# Patient Record
Sex: Male | Born: 1935 | Race: White | Hispanic: No
Health system: Southern US, Community
[De-identification: ages and names within clinical notes are randomized; demographics above are authoritative.]

---

## 2005-01-22 ENCOUNTER — Emergency Department: Payer: Self-pay | Admitting: Emergency Medicine

## 2005-04-01 ENCOUNTER — Other Ambulatory Visit: Payer: Self-pay

## 2005-04-01 ENCOUNTER — Emergency Department: Payer: Self-pay | Admitting: Unknown Physician Specialty

## 2005-04-27 ENCOUNTER — Ambulatory Visit: Payer: Self-pay | Admitting: Internal Medicine

## 2005-05-06 ENCOUNTER — Ambulatory Visit: Payer: Self-pay | Admitting: Internal Medicine

## 2005-05-17 ENCOUNTER — Ambulatory Visit: Payer: Self-pay | Admitting: Oncology

## 2005-05-27 ENCOUNTER — Ambulatory Visit: Payer: Self-pay | Admitting: Oncology

## 2005-06-21 ENCOUNTER — Ambulatory Visit: Payer: Self-pay | Admitting: Oncology

## 2005-06-26 ENCOUNTER — Ambulatory Visit: Payer: Self-pay | Admitting: Oncology

## 2005-07-05 ENCOUNTER — Other Ambulatory Visit: Payer: Self-pay

## 2005-07-29 ENCOUNTER — Ambulatory Visit: Payer: Self-pay | Admitting: Otolaryngology

## 2005-07-29 ENCOUNTER — Other Ambulatory Visit: Payer: Self-pay

## 2005-08-03 ENCOUNTER — Other Ambulatory Visit: Payer: Self-pay

## 2005-08-03 ENCOUNTER — Inpatient Hospital Stay: Payer: Self-pay

## 2005-09-29 ENCOUNTER — Ambulatory Visit: Payer: Self-pay | Admitting: Oncology

## 2005-10-27 ENCOUNTER — Ambulatory Visit: Payer: Self-pay | Admitting: Oncology

## 2005-11-17 ENCOUNTER — Other Ambulatory Visit: Payer: Self-pay

## 2005-11-24 ENCOUNTER — Ambulatory Visit: Payer: Self-pay | Admitting: Oncology

## 2005-12-25 ENCOUNTER — Ambulatory Visit: Payer: Self-pay | Admitting: Oncology

## 2006-02-09 ENCOUNTER — Ambulatory Visit: Payer: Self-pay | Admitting: Oncology

## 2006-02-24 ENCOUNTER — Ambulatory Visit: Payer: Self-pay | Admitting: Oncology

## 2006-03-28 ENCOUNTER — Observation Stay: Payer: Self-pay | Admitting: Internal Medicine

## 2006-03-28 ENCOUNTER — Other Ambulatory Visit: Payer: Self-pay

## 2006-04-21 ENCOUNTER — Ambulatory Visit: Payer: Self-pay | Admitting: Oncology

## 2006-04-26 ENCOUNTER — Ambulatory Visit: Payer: Self-pay | Admitting: Oncology

## 2006-06-09 ENCOUNTER — Ambulatory Visit: Payer: Self-pay | Admitting: Oncology

## 2006-06-21 ENCOUNTER — Other Ambulatory Visit: Payer: Self-pay

## 2006-06-21 ENCOUNTER — Inpatient Hospital Stay: Payer: Self-pay

## 2006-06-26 ENCOUNTER — Ambulatory Visit: Payer: Self-pay | Admitting: Oncology

## 2006-08-25 ENCOUNTER — Ambulatory Visit: Payer: Self-pay | Admitting: Oncology

## 2006-10-20 ENCOUNTER — Ambulatory Visit: Payer: Self-pay | Admitting: Oncology

## 2006-10-27 ENCOUNTER — Ambulatory Visit: Payer: Self-pay | Admitting: Oncology

## 2006-11-25 ENCOUNTER — Ambulatory Visit: Payer: Self-pay | Admitting: Oncology

## 2007-01-25 ENCOUNTER — Ambulatory Visit: Payer: Self-pay | Admitting: Oncology

## 2007-02-25 ENCOUNTER — Ambulatory Visit: Payer: Self-pay | Admitting: Oncology

## 2007-04-27 ENCOUNTER — Ambulatory Visit: Payer: Self-pay | Admitting: Oncology

## 2007-05-02 ENCOUNTER — Ambulatory Visit: Payer: Self-pay | Admitting: Oncology

## 2007-05-28 ENCOUNTER — Ambulatory Visit: Payer: Self-pay | Admitting: Oncology

## 2007-09-27 IMAGING — CR DG OUTSIDE FILMS CHEST
1 series · 1 of 1 positions shown · non-contrast
Comparison: none

[view not recorded]
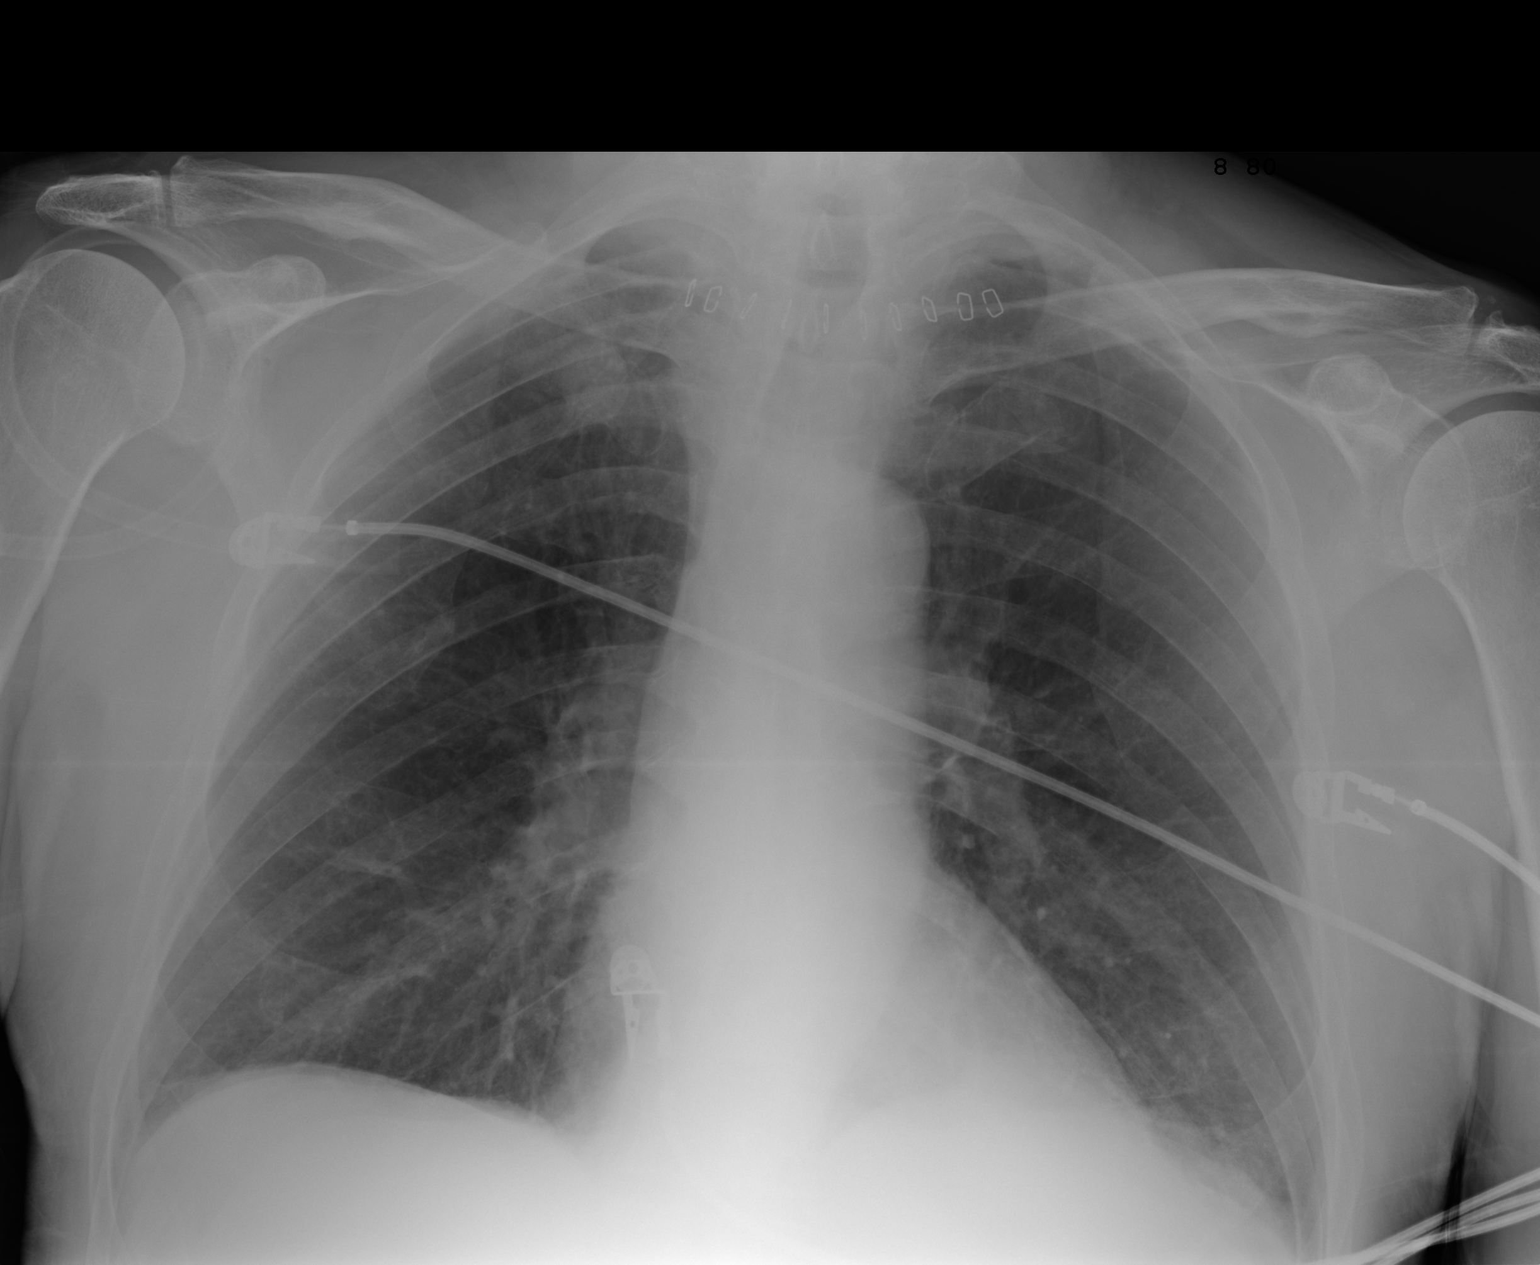

[1 of 1 positions shown; findings below may reference images not displayed]

Canned report from images found in remote index.

Refer to host system for actual result text.

## 2008-03-26 ENCOUNTER — Ambulatory Visit: Payer: Self-pay | Admitting: Oncology

## 2008-10-02 ENCOUNTER — Emergency Department: Payer: Self-pay | Admitting: Internal Medicine

## 2008-11-28 ENCOUNTER — Inpatient Hospital Stay: Payer: Self-pay | Admitting: Internal Medicine

## 2010-11-02 ENCOUNTER — Ambulatory Visit: Payer: Self-pay | Admitting: Internal Medicine

## 2010-11-08 ENCOUNTER — Emergency Department: Payer: Self-pay | Admitting: Emergency Medicine

## 2011-04-02 ENCOUNTER — Emergency Department: Payer: Self-pay | Admitting: Emergency Medicine

## 2011-04-08 ENCOUNTER — Observation Stay: Payer: Self-pay | Admitting: Internal Medicine

## 2011-07-07 ENCOUNTER — Ambulatory Visit: Payer: Self-pay | Admitting: Internal Medicine

## 2011-08-20 ENCOUNTER — Inpatient Hospital Stay: Payer: Self-pay | Admitting: Internal Medicine

## 2012-03-22 ENCOUNTER — Inpatient Hospital Stay: Payer: Self-pay | Admitting: Internal Medicine

## 2012-03-22 LAB — CBC
HCT: 40.4 % (ref 40.0–52.0)
MCHC: 33.7 g/dL (ref 32.0–36.0)
MCV: 99 fL (ref 80–100)
Platelet: 194 10*3/uL (ref 150–440)
RBC: 4.07 10*6/uL — ABNORMAL LOW (ref 4.40–5.90)
RDW: 13.2 % (ref 11.5–14.5)
WBC: 16.1 10*3/uL — ABNORMAL HIGH (ref 3.8–10.6)

## 2012-03-22 LAB — URINALYSIS, COMPLETE
Bacteria: NONE SEEN
Bilirubin,UR: NEGATIVE
Blood: NEGATIVE
Leukocyte Esterase: NEGATIVE
Protein: NEGATIVE
RBC,UR: <1 /HPF (ref 0–5)
Specific Gravity: 1.022 (ref 1.003–1.030)
WBC UR: 1 /HPF (ref 0–5)

## 2012-03-22 LAB — PROTIME-INR: Prothrombin Time: 13 secs (ref 11.5–14.7)

## 2012-03-22 LAB — COMPREHENSIVE METABOLIC PANEL
Albumin: 4 g/dL (ref 3.4–5.0)
Alkaline Phosphatase: 74 U/L (ref 50–136)
Bilirubin,Total: 0.6 mg/dL (ref 0.2–1.0)
Calcium, Total: 8.8 mg/dL (ref 8.5–10.1)
Chloride: 106 mmol/L (ref 98–107)
EGFR (Non-African Amer.): 45 — ABNORMAL LOW
Glucose: 242 mg/dL — ABNORMAL HIGH (ref 65–99)
Osmolality: 293 (ref 275–301)
Sodium: 141 mmol/L (ref 136–145)

## 2012-03-23 LAB — CBC WITH DIFFERENTIAL/PLATELET
Basophil #: 0 10*3/uL (ref 0.0–0.1)
Basophil %: 0.2 %
Eosinophil #: 0.2 10*3/uL (ref 0.0–0.7)
HCT: 35.6 % — ABNORMAL LOW (ref 40.0–52.0)
HGB: 12.1 g/dL — ABNORMAL LOW (ref 13.0–18.0)
Lymphocyte #: 2.2 10*3/uL (ref 1.0–3.6)
Lymphocyte %: 11.6 %
MCH: 33.3 pg (ref 26.0–34.0)
MCV: 98 fL (ref 80–100)
Monocyte #: 1.4 x10 3/mm — ABNORMAL HIGH (ref 0.2–1.0)
Neutrophil #: 15.2 10*3/uL — ABNORMAL HIGH (ref 1.4–6.5)
RDW: 13.1 % (ref 11.5–14.5)
WBC: 19 10*3/uL — ABNORMAL HIGH (ref 3.8–10.6)

## 2012-03-23 LAB — BASIC METABOLIC PANEL
Anion Gap: 7 (ref 7–16)
BUN: 25 mg/dL — ABNORMAL HIGH (ref 7–18)
Calcium, Total: 8.6 mg/dL (ref 8.5–10.1)
Chloride: 106 mmol/L (ref 98–107)
EGFR (African American): 54 — ABNORMAL LOW
EGFR (Non-African Amer.): 47 — ABNORMAL LOW
Glucose: 146 mg/dL — ABNORMAL HIGH (ref 65–99)
Osmolality: 285 (ref 275–301)
Potassium: 3.9 mmol/L (ref 3.5–5.1)
Sodium: 139 mmol/L (ref 136–145)

## 2012-03-24 LAB — BASIC METABOLIC PANEL
BUN: 16 mg/dL (ref 7–18)
Chloride: 103 mmol/L (ref 98–107)
Co2: 24 mmol/L (ref 21–32)
Creatinine: 1.31 mg/dL — ABNORMAL HIGH (ref 0.60–1.30)
EGFR (African American): 56 — ABNORMAL LOW
EGFR (Non-African Amer.): 48 — ABNORMAL LOW
Sodium: 136 mmol/L (ref 136–145)

## 2012-03-24 LAB — CBC WITH DIFFERENTIAL/PLATELET
Basophil #: 0 10*3/uL (ref 0.0–0.1)
Basophil %: 0.2 %
HGB: 11.8 g/dL — ABNORMAL LOW (ref 13.0–18.0)
Lymphocyte #: 2.2 10*3/uL (ref 1.0–3.6)
Lymphocyte %: 17 %
MCHC: 33.6 g/dL (ref 32.0–36.0)
Monocyte %: 9.1 %
Neutrophil %: 70.3 %
Platelet: 161 10*3/uL (ref 150–440)
WBC: 12.9 10*3/uL — ABNORMAL HIGH (ref 3.8–10.6)

## 2012-03-24 LAB — TSH: Thyroid Stimulating Horm: 8.43 u[IU]/mL — ABNORMAL HIGH

## 2012-03-25 LAB — CBC WITH DIFFERENTIAL/PLATELET
Basophil %: 0.3 %
HGB: 11.1 g/dL — ABNORMAL LOW (ref 13.0–18.0)
Lymphocyte #: 1.8 10*3/uL (ref 1.0–3.6)
Lymphocyte %: 20 %
MCHC: 34 g/dL (ref 32.0–36.0)
MCV: 98 fL (ref 80–100)
Monocyte #: 0.9 x10 3/mm (ref 0.2–1.0)
Monocyte %: 10.3 %
RDW: 12.9 % (ref 11.5–14.5)
WBC: 9 10*3/uL (ref 3.8–10.6)

## 2012-03-25 LAB — BASIC METABOLIC PANEL
Anion Gap: 10 (ref 7–16)
Calcium, Total: 7.9 mg/dL — ABNORMAL LOW (ref 8.5–10.1)
Chloride: 106 mmol/L (ref 98–107)
EGFR (African American): >60
Osmolality: 277 (ref 275–301)
Sodium: 139 mmol/L (ref 136–145)

## 2012-03-28 LAB — CULTURE, BLOOD (SINGLE)

## 2012-07-08 ENCOUNTER — Inpatient Hospital Stay: Payer: Self-pay | Admitting: Internal Medicine

## 2012-07-08 LAB — URINALYSIS, COMPLETE
Bacteria: NONE SEEN
Bilirubin,UR: NEGATIVE
Glucose,UR: 50 mg/dL (ref 0–75)
Ketone: NEGATIVE
Protein: NEGATIVE
RBC,UR: <1 /HPF (ref 0–5)
Specific Gravity: 1.021 (ref 1.003–1.030)
Squamous Epithelial: NONE SEEN
WBC UR: 1 /HPF (ref 0–5)

## 2012-07-08 LAB — COMPREHENSIVE METABOLIC PANEL
Alkaline Phosphatase: 72 U/L (ref 50–136)
Chloride: 106 mmol/L (ref 98–107)
Co2: 27 mmol/L (ref 21–32)
Creatinine: 1.32 mg/dL — ABNORMAL HIGH (ref 0.60–1.30)
EGFR (African American): 55 — ABNORMAL LOW
EGFR (Non-African Amer.): 48 — ABNORMAL LOW
Glucose: 129 mg/dL — ABNORMAL HIGH (ref 65–99)
SGOT(AST): 34 U/L (ref 15–37)
SGPT (ALT): 22 U/L (ref 12–78)
Total Protein: 7.1 g/dL (ref 6.4–8.2)

## 2012-07-08 LAB — CK TOTAL AND CKMB (NOT AT ARMC): CK-MB: 1.9 ng/mL (ref 0.5–3.6)

## 2012-07-08 LAB — CBC
HCT: 37.2 % — ABNORMAL LOW (ref 40.0–52.0)
HGB: 12.8 g/dL — ABNORMAL LOW (ref 13.0–18.0)
MCH: 33.8 pg (ref 26.0–34.0)
Platelet: 156 10*3/uL (ref 150–440)
RBC: 3.78 10*6/uL — ABNORMAL LOW (ref 4.40–5.90)
WBC: 8.6 10*3/uL (ref 3.8–10.6)

## 2012-07-09 LAB — CBC WITH DIFFERENTIAL/PLATELET
Basophil #: 0 10*3/uL (ref 0.0–0.1)
Basophil %: 0.2 %
Eosinophil #: 0 10*3/uL (ref 0.0–0.7)
HCT: 36.8 % — ABNORMAL LOW (ref 40.0–52.0)
Lymphocyte #: 0.5 10*3/uL — ABNORMAL LOW (ref 1.0–3.6)
MCH: 34.4 pg — ABNORMAL HIGH (ref 26.0–34.0)
MCHC: 35 g/dL (ref 32.0–36.0)
MCV: 99 fL (ref 80–100)
Monocyte #: 0.2 x10 3/mm (ref 0.2–1.0)
Neutrophil %: 91.6 %
Platelet: 154 10*3/uL (ref 150–440)
RDW: 13.8 % (ref 11.5–14.5)
WBC: 8.3 10*3/uL (ref 3.8–10.6)

## 2012-07-09 LAB — URINALYSIS, COMPLETE
Hyaline Cast: 3
Ketone: NEGATIVE
Nitrite: NEGATIVE
Ph: 5 (ref 4.5–8.0)
Protein: NEGATIVE
Specific Gravity: 1.009 (ref 1.003–1.030)
WBC UR: 1 /HPF (ref 0–5)

## 2012-07-09 LAB — BASIC METABOLIC PANEL
BUN: 15 mg/dL (ref 7–18)
Chloride: 105 mmol/L (ref 98–107)
Co2: 28 mmol/L (ref 21–32)
Creatinine: 1.11 mg/dL (ref 0.60–1.30)
Osmolality: 284 (ref 275–301)
Potassium: 4.4 mmol/L (ref 3.5–5.1)
Sodium: 139 mmol/L (ref 136–145)

## 2012-07-11 LAB — EXPECTORATED SPUTUM ASSESSMENT W REFEX TO RESP CULTURE

## 2012-07-12 LAB — URINALYSIS, COMPLETE
Bacteria: NONE SEEN
Bilirubin,UR: NEGATIVE
Glucose,UR: 150 mg/dL (ref 0–75)
Leukocyte Esterase: NEGATIVE
Ph: 7 (ref 4.5–8.0)
RBC,UR: 1 /HPF (ref 0–5)
Squamous Epithelial: <1
WBC UR: <1 /HPF (ref 0–5)

## 2012-07-12 LAB — CREATININE, SERUM: Creatinine: 1.16 mg/dL (ref 0.60–1.30)

## 2012-07-14 LAB — CULTURE, BLOOD (SINGLE)

## 2012-08-14 ENCOUNTER — Ambulatory Visit: Payer: Self-pay | Admitting: Otolaryngology

## 2012-08-14 LAB — T4, FREE: Free Thyroxine: 1.21 ng/dL (ref 0.76–1.46)

## 2012-08-14 LAB — TSH: Thyroid Stimulating Horm: 1.5 u[IU]/mL

## 2012-09-06 ENCOUNTER — Ambulatory Visit: Payer: Self-pay | Admitting: Specialist

## 2012-09-26 ENCOUNTER — Ambulatory Visit: Payer: Self-pay | Admitting: Internal Medicine

## 2012-09-28 ENCOUNTER — Inpatient Hospital Stay: Payer: Self-pay | Admitting: Internal Medicine

## 2012-09-28 LAB — CBC WITH DIFFERENTIAL/PLATELET
Basophil %: 0.4 %
Eosinophil #: 0 10*3/uL (ref 0.0–0.7)
Eosinophil %: 0.2 %
Lymphocyte #: 0.6 10*3/uL — ABNORMAL LOW (ref 1.0–3.6)
MCH: 33.1 pg (ref 26.0–34.0)
MCV: 100 fL (ref 80–100)
Monocyte #: 0.5 x10 3/mm (ref 0.2–1.0)
Monocyte %: 2.8 %
Neutrophil #: 17.4 10*3/uL — ABNORMAL HIGH (ref 1.4–6.5)
Neutrophil %: 93.3 %
RBC: 3.86 10*6/uL — ABNORMAL LOW (ref 4.40–5.90)
WBC: 18.6 10*3/uL — ABNORMAL HIGH (ref 3.8–10.6)

## 2012-09-28 LAB — COMPREHENSIVE METABOLIC PANEL
Albumin: 4 g/dL (ref 3.4–5.0)
Alkaline Phosphatase: 123 U/L (ref 50–136)
Anion Gap: 16 (ref 7–16)
BUN: 28 mg/dL — ABNORMAL HIGH (ref 7–18)
Chloride: 102 mmol/L (ref 98–107)
EGFR (African American): 28 — ABNORMAL LOW
Glucose: 201 mg/dL — ABNORMAL HIGH (ref 65–99)
Osmolality: 289 (ref 275–301)
SGOT(AST): 65 U/L — ABNORMAL HIGH (ref 15–37)
Sodium: 139 mmol/L (ref 136–145)
Total Protein: 7.7 g/dL (ref 6.4–8.2)

## 2012-09-28 LAB — URINALYSIS, COMPLETE
Bilirubin,UR: NEGATIVE
Blood: NEGATIVE
Glucose,UR: >=500 mg/dL (ref 0–75)
Hyaline Cast: 1
Nitrite: NEGATIVE
Ph: 5 (ref 4.5–8.0)
RBC,UR: 5 /HPF (ref 0–5)
WBC UR: 1 /HPF (ref 0–5)

## 2012-09-28 LAB — RAPID INFLUENZA A&B ANTIGENS

## 2012-09-28 LAB — TROPONIN I: Troponin-I: <0.02 ng/mL

## 2012-09-29 LAB — CBC WITH DIFFERENTIAL/PLATELET
Basophil #: 0 10*3/uL (ref 0.0–0.1)
Basophil %: 0.1 %
Eosinophil #: 0 10*3/uL (ref 0.0–0.7)
Eosinophil %: 0 %
HGB: 11.6 g/dL — ABNORMAL LOW (ref 13.0–18.0)
Lymphocyte #: 1.2 10*3/uL (ref 1.0–3.6)
Lymphocyte %: 5.5 %
MCH: 34.6 pg — ABNORMAL HIGH (ref 26.0–34.0)
MCHC: 35.4 g/dL (ref 32.0–36.0)
Monocyte #: 0.9 x10 3/mm (ref 0.2–1.0)
Monocyte %: 4.4 %
Neutrophil %: 90 %
RBC: 3.36 10*6/uL — ABNORMAL LOW (ref 4.40–5.90)

## 2012-09-29 LAB — BASIC METABOLIC PANEL
Anion Gap: 12 (ref 7–16)
Calcium, Total: 8.5 mg/dL (ref 8.5–10.1)
Chloride: 107 mmol/L (ref 98–107)
Co2: 26 mmol/L (ref 21–32)
Creatinine: 1.6 mg/dL — ABNORMAL HIGH (ref 0.60–1.30)
EGFR (African American): 44 — ABNORMAL LOW
Glucose: 257 mg/dL — ABNORMAL HIGH (ref 65–99)
Osmolality: 310 (ref 275–301)
Sodium: 145 mmol/L (ref 136–145)

## 2012-09-30 LAB — BASIC METABOLIC PANEL
BUN: 40 mg/dL — ABNORMAL HIGH (ref 7–18)
Chloride: 111 mmol/L — ABNORMAL HIGH (ref 98–107)
Co2: 27 mmol/L (ref 21–32)
Creatinine: 1.21 mg/dL (ref 0.60–1.30)
EGFR (African American): >60
EGFR (Non-African Amer.): 53 — ABNORMAL LOW
Glucose: 358 mg/dL — ABNORMAL HIGH (ref 65–99)
Sodium: 146 mmol/L — ABNORMAL HIGH (ref 136–145)

## 2012-09-30 LAB — CBC WITH DIFFERENTIAL/PLATELET
Basophil #: 0 10*3/uL (ref 0.0–0.1)
Basophil %: 0.1 %
Eosinophil #: 0 10*3/uL (ref 0.0–0.7)
Eosinophil %: 0 %
HCT: 34.7 % — ABNORMAL LOW (ref 40.0–52.0)
Lymphocyte %: 5.3 %
MCHC: 33.7 g/dL (ref 32.0–36.0)
Monocyte %: 3.8 %
Neutrophil #: 15.5 10*3/uL — ABNORMAL HIGH (ref 1.4–6.5)
Neutrophil %: 90.8 %
Platelet: 156 10*3/uL (ref 150–440)
RDW: 13.4 % (ref 11.5–14.5)

## 2012-09-30 LAB — URINE CULTURE

## 2012-10-01 LAB — CBC WITH DIFFERENTIAL/PLATELET
Basophil %: 0 %
Eosinophil #: 0 10*3/uL (ref 0.0–0.7)
Eosinophil %: 0 %
HGB: 12.4 g/dL — ABNORMAL LOW (ref 13.0–18.0)
Lymphocyte #: 1 10*3/uL (ref 1.0–3.6)
MCH: 33.4 pg (ref 26.0–34.0)
MCHC: 34.3 g/dL (ref 32.0–36.0)
MCV: 97 fL (ref 80–100)
Neutrophil #: 14 10*3/uL — ABNORMAL HIGH (ref 1.4–6.5)
Platelet: 152 10*3/uL (ref 150–440)
RBC: 3.72 10*6/uL — ABNORMAL LOW (ref 4.40–5.90)
WBC: 15.8 10*3/uL — ABNORMAL HIGH (ref 3.8–10.6)

## 2012-10-01 LAB — BASIC METABOLIC PANEL
Anion Gap: 9 (ref 7–16)
Calcium, Total: 8.7 mg/dL (ref 8.5–10.1)
Chloride: 110 mmol/L — ABNORMAL HIGH (ref 98–107)
Co2: 27 mmol/L (ref 21–32)
Creatinine: 0.99 mg/dL (ref 0.60–1.30)
EGFR (African American): >60
EGFR (Non-African Amer.): >60
Glucose: 247 mg/dL — ABNORMAL HIGH (ref 65–99)
Potassium: 3.9 mmol/L (ref 3.5–5.1)
Sodium: 146 mmol/L — ABNORMAL HIGH (ref 136–145)

## 2012-10-02 LAB — CBC WITH DIFFERENTIAL/PLATELET
Basophil #: 0 10*3/uL (ref 0.0–0.1)
Basophil %: 0.2 %
Eosinophil #: 0 10*3/uL (ref 0.0–0.7)
HCT: 39.3 % — ABNORMAL LOW (ref 40.0–52.0)
HGB: 13.1 g/dL (ref 13.0–18.0)
Lymphocyte %: 11.5 %
MCHC: 33.5 g/dL (ref 32.0–36.0)
MCV: 98 fL (ref 80–100)
Monocyte #: 1 x10 3/mm (ref 0.2–1.0)
Monocyte %: 7 %
RBC: 4.03 10*6/uL — ABNORMAL LOW (ref 4.40–5.90)
WBC: 14.4 10*3/uL — ABNORMAL HIGH (ref 3.8–10.6)

## 2012-10-02 LAB — URINALYSIS, COMPLETE
Glucose,UR: >=500 mg/dL (ref 0–75)
Ph: 6 (ref 4.5–8.0)
Protein: NEGATIVE
RBC,UR: 1 /HPF (ref 0–5)
Specific Gravity: 1.029 (ref 1.003–1.030)
Squamous Epithelial: <1
WBC UR: 5 /HPF (ref 0–5)

## 2012-10-02 LAB — CULTURE, BLOOD (SINGLE)

## 2012-10-27 ENCOUNTER — Ambulatory Visit: Payer: Self-pay | Admitting: Internal Medicine

## 2012-11-01 ENCOUNTER — Emergency Department: Payer: Self-pay | Admitting: Emergency Medicine

## 2012-11-01 LAB — COMPREHENSIVE METABOLIC PANEL
Albumin: 2.8 g/dL — ABNORMAL LOW (ref 3.4–5.0)
Alkaline Phosphatase: 100 U/L (ref 50–136)
Anion Gap: 8 (ref 7–16)
BUN: 12 mg/dL (ref 7–18)
Bilirubin,Total: 0.3 mg/dL (ref 0.2–1.0)
Calcium, Total: 8.6 mg/dL (ref 8.5–10.1)
Chloride: 104 mmol/L (ref 98–107)
EGFR (African American): >60
EGFR (Non-African Amer.): >60
Glucose: 104 mg/dL — ABNORMAL HIGH (ref 65–99)
Potassium: 3.3 mmol/L — ABNORMAL LOW (ref 3.5–5.1)
SGPT (ALT): 17 U/L (ref 12–78)
Sodium: 140 mmol/L (ref 136–145)

## 2012-11-01 LAB — PRO B NATRIURETIC PEPTIDE: B-Type Natriuretic Peptide: 208 pg/mL (ref 0–450)

## 2012-11-01 LAB — CBC
HCT: 36.8 % — ABNORMAL LOW (ref 40.0–52.0)
MCH: 32.3 pg (ref 26.0–34.0)
MCHC: 33.5 g/dL (ref 32.0–36.0)
MCV: 97 fL (ref 80–100)
RBC: 3.82 10*6/uL — ABNORMAL LOW (ref 4.40–5.90)
RDW: 13.8 % (ref 11.5–14.5)

## 2012-11-01 LAB — TROPONIN I: Troponin-I: <0.02 ng/mL

## 2012-11-01 LAB — CK TOTAL AND CKMB (NOT AT ARMC): CK-MB: 1.3 ng/mL (ref 0.5–3.6)

## 2012-11-03 ENCOUNTER — Inpatient Hospital Stay: Payer: Self-pay | Admitting: Internal Medicine

## 2012-11-03 LAB — CBC
HCT: 33.9 % — ABNORMAL LOW (ref 40.0–52.0)
HGB: 11.6 g/dL — ABNORMAL LOW (ref 13.0–18.0)
MCH: 32.8 pg (ref 26.0–34.0)
MCHC: 34.3 g/dL (ref 32.0–36.0)
MCV: 96 fL (ref 80–100)
Platelet: 362 10*3/uL (ref 150–440)
RBC: 3.54 10*6/uL — ABNORMAL LOW (ref 4.40–5.90)

## 2012-11-03 LAB — COMPREHENSIVE METABOLIC PANEL
Anion Gap: 7 (ref 7–16)
BUN: 12 mg/dL (ref 7–18)
Calcium, Total: 8.2 mg/dL — ABNORMAL LOW (ref 8.5–10.1)
Chloride: 105 mmol/L (ref 98–107)
Co2: 28 mmol/L (ref 21–32)
Creatinine: 1.03 mg/dL (ref 0.60–1.30)
EGFR (African American): >60
EGFR (Non-African Amer.): >60
Osmolality: 285 (ref 275–301)
Potassium: 3.3 mmol/L — ABNORMAL LOW (ref 3.5–5.1)
SGOT(AST): 23 U/L (ref 15–37)
SGPT (ALT): 15 U/L (ref 12–78)
Total Protein: 6.6 g/dL (ref 6.4–8.2)

## 2012-11-04 LAB — BASIC METABOLIC PANEL
Calcium, Total: 8.1 mg/dL — ABNORMAL LOW (ref 8.5–10.1)
Co2: 27 mmol/L (ref 21–32)
EGFR (African American): >60
EGFR (Non-African Amer.): >60
Osmolality: 281 (ref 275–301)

## 2012-11-04 LAB — CBC WITH DIFFERENTIAL/PLATELET
Basophil #: 0.1 10*3/uL (ref 0.0–0.1)
Basophil %: 0.9 %
Eosinophil #: 0.8 10*3/uL — ABNORMAL HIGH (ref 0.0–0.7)
Eosinophil %: 9.8 %
HCT: 34.1 % — ABNORMAL LOW (ref 40.0–52.0)
Lymphocyte #: 1.7 10*3/uL (ref 1.0–3.6)
Lymphocyte %: 20 %
MCHC: 33.1 g/dL (ref 32.0–36.0)
Monocyte %: 13.6 %
Platelet: 373 10*3/uL (ref 150–440)
RBC: 3.53 10*6/uL — ABNORMAL LOW (ref 4.40–5.90)
RDW: 14.2 % (ref 11.5–14.5)

## 2012-11-07 LAB — CULTURE, BLOOD (SINGLE)

## 2012-11-09 LAB — CULTURE, BLOOD (SINGLE)

## 2012-11-24 ENCOUNTER — Ambulatory Visit: Payer: Self-pay | Admitting: Internal Medicine

## 2012-12-01 LAB — URINALYSIS, COMPLETE
Bacteria: NONE SEEN
Bilirubin,UR: NEGATIVE
Blood: NEGATIVE
Glucose,UR: >=500 mg/dL (ref 0–75)
Nitrite: NEGATIVE
RBC,UR: 3 /HPF (ref 0–5)
WBC UR: 2 /HPF (ref 0–5)

## 2012-12-01 LAB — COMPREHENSIVE METABOLIC PANEL
Albumin: 3.2 g/dL — ABNORMAL LOW (ref 3.4–5.0)
BUN: 13 mg/dL (ref 7–18)
Bilirubin,Total: 0.6 mg/dL (ref 0.2–1.0)
Calcium, Total: 8.7 mg/dL (ref 8.5–10.1)
Chloride: 100 mmol/L (ref 98–107)
EGFR (African American): >60
Glucose: 86 mg/dL (ref 65–99)
Osmolality: 277 (ref 275–301)
Potassium: 2.6 mmol/L — ABNORMAL LOW (ref 3.5–5.1)
SGOT(AST): 27 U/L (ref 15–37)
Sodium: 139 mmol/L (ref 136–145)
Total Protein: 8.5 g/dL — ABNORMAL HIGH (ref 6.4–8.2)

## 2012-12-01 LAB — CBC
HCT: 40.6 % (ref 40.0–52.0)
HGB: 13.9 g/dL (ref 13.0–18.0)
MCH: 32.7 pg (ref 26.0–34.0)
MCHC: 34.2 g/dL (ref 32.0–36.0)
RDW: 14.3 % (ref 11.5–14.5)
WBC: 18.1 10*3/uL — ABNORMAL HIGH (ref 3.8–10.6)

## 2012-12-01 LAB — TROPONIN I: Troponin-I: <0.02 ng/mL

## 2012-12-02 ENCOUNTER — Inpatient Hospital Stay: Payer: Self-pay | Admitting: Family Medicine

## 2012-12-02 LAB — MAGNESIUM: Magnesium: 1.8 mg/dL

## 2012-12-02 LAB — RAPID INFLUENZA A&B ANTIGENS

## 2012-12-03 LAB — BASIC METABOLIC PANEL
BUN: 14 mg/dL (ref 7–18)
Co2: 30 mmol/L (ref 21–32)
Creatinine: 0.99 mg/dL (ref 0.60–1.30)
EGFR (African American): >60
EGFR (Non-African Amer.): >60
Osmolality: 286 (ref 275–301)
Sodium: 138 mmol/L (ref 136–145)

## 2012-12-03 LAB — CBC WITH DIFFERENTIAL/PLATELET
Eosinophil #: 0 10*3/uL (ref 0.0–0.7)
Eosinophil %: 0 %
HCT: 31.9 % — ABNORMAL LOW (ref 40.0–52.0)
HGB: 11.1 g/dL — ABNORMAL LOW (ref 13.0–18.0)
Lymphocyte #: 1.1 10*3/uL (ref 1.0–3.6)
Lymphocyte %: 13.9 %
MCHC: 34.8 g/dL (ref 32.0–36.0)
MCV: 95 fL (ref 80–100)
Monocyte #: 0.5 x10 3/mm (ref 0.2–1.0)
Neutrophil %: 80.2 %
RBC: 3.36 10*6/uL — ABNORMAL LOW (ref 4.40–5.90)

## 2012-12-05 LAB — CBC WITH DIFFERENTIAL/PLATELET
Basophil #: 0 10*3/uL (ref 0.0–0.1)
Basophil %: 0.2 %
Eosinophil #: 0 10*3/uL (ref 0.0–0.7)
HGB: 12.2 g/dL — ABNORMAL LOW (ref 13.0–18.0)
Lymphocyte #: 0.8 10*3/uL — ABNORMAL LOW (ref 1.0–3.6)
Lymphocyte %: 4.8 %
MCHC: 32.9 g/dL (ref 32.0–36.0)
MCV: 96 fL (ref 80–100)
Monocyte #: 0.5 x10 3/mm (ref 0.2–1.0)
Neutrophil #: 14.8 10*3/uL — ABNORMAL HIGH (ref 1.4–6.5)
Platelet: 202 10*3/uL (ref 150–440)
RBC: 3.86 10*6/uL — ABNORMAL LOW (ref 4.40–5.90)

## 2012-12-05 LAB — BASIC METABOLIC PANEL
Anion Gap: 4 — ABNORMAL LOW (ref 7–16)
BUN: 19 mg/dL — ABNORMAL HIGH (ref 7–18)
Calcium, Total: 8.2 mg/dL — ABNORMAL LOW (ref 8.5–10.1)
Chloride: 104 mmol/L (ref 98–107)
Glucose: 251 mg/dL — ABNORMAL HIGH (ref 65–99)

## 2012-12-06 LAB — BASIC METABOLIC PANEL
BUN: 26 mg/dL — ABNORMAL HIGH (ref 7–18)
Co2: 30 mmol/L (ref 21–32)
Creatinine: 1.36 mg/dL — ABNORMAL HIGH (ref 0.60–1.30)
Glucose: 197 mg/dL — ABNORMAL HIGH (ref 65–99)
Potassium: 3.6 mmol/L (ref 3.5–5.1)
Sodium: 138 mmol/L (ref 136–145)

## 2012-12-06 LAB — CBC WITH DIFFERENTIAL/PLATELET
Eosinophil #: 0 10*3/uL (ref 0.0–0.7)
Eosinophil %: 0 %
HGB: 12.2 g/dL — ABNORMAL LOW (ref 13.0–18.0)
Lymphocyte %: 9.8 %
MCH: 32.9 pg (ref 26.0–34.0)
MCHC: 34.7 g/dL (ref 32.0–36.0)
Neutrophil %: 86.7 %
Platelet: 188 10*3/uL (ref 150–440)
RBC: 3.71 10*6/uL — ABNORMAL LOW (ref 4.40–5.90)
WBC: 14.1 10*3/uL — ABNORMAL HIGH (ref 3.8–10.6)

## 2012-12-10 LAB — BASIC METABOLIC PANEL
Anion Gap: 4 — ABNORMAL LOW (ref 7–16)
Calcium, Total: 8 mg/dL — ABNORMAL LOW (ref 8.5–10.1)
Co2: 35 mmol/L — ABNORMAL HIGH (ref 21–32)
Creatinine: 0.95 mg/dL (ref 0.60–1.30)
EGFR (Non-African Amer.): >60
Osmolality: 289 (ref 275–301)
Potassium: 3.3 mmol/L — ABNORMAL LOW (ref 3.5–5.1)

## 2012-12-10 LAB — CBC WITH DIFFERENTIAL/PLATELET
HCT: 42.9 % (ref 40.0–52.0)
HGB: 14.5 g/dL (ref 13.0–18.0)
Lymphocyte #: 3.2 10*3/uL (ref 1.0–3.6)
Lymphocyte %: 16.5 %
MCH: 32.6 pg (ref 26.0–34.0)
MCHC: 33.8 g/dL (ref 32.0–36.0)
Monocyte #: 1.5 x10 3/mm — ABNORMAL HIGH (ref 0.2–1.0)
Monocyte %: 7.6 %
RBC: 4.46 10*6/uL (ref 4.40–5.90)
RDW: 14.3 % (ref 11.5–14.5)

## 2012-12-13 LAB — BASIC METABOLIC PANEL
BUN: 41 mg/dL — ABNORMAL HIGH (ref 7–18)
Chloride: 98 mmol/L (ref 98–107)
EGFR (Non-African Amer.): 52 — ABNORMAL LOW
Glucose: 81 mg/dL (ref 65–99)

## 2012-12-14 LAB — PLATELET COUNT: Platelet: 166 10*3/uL (ref 150–440)

## 2012-12-25 ENCOUNTER — Ambulatory Visit: Payer: Self-pay | Admitting: Internal Medicine

## 2012-12-25 DEATH — deceased

## 2013-08-30 IMAGING — CR DG CHEST 2V
1 series · 3 of 3 positions shown · non-contrast
Comparison: none

REASON FOR EXAM: cough x 2 weeks
COMMENTS:

[Series 1: view not recorded · 0.17mm/px · 3 of 3 slices shown]
[im 1/3]
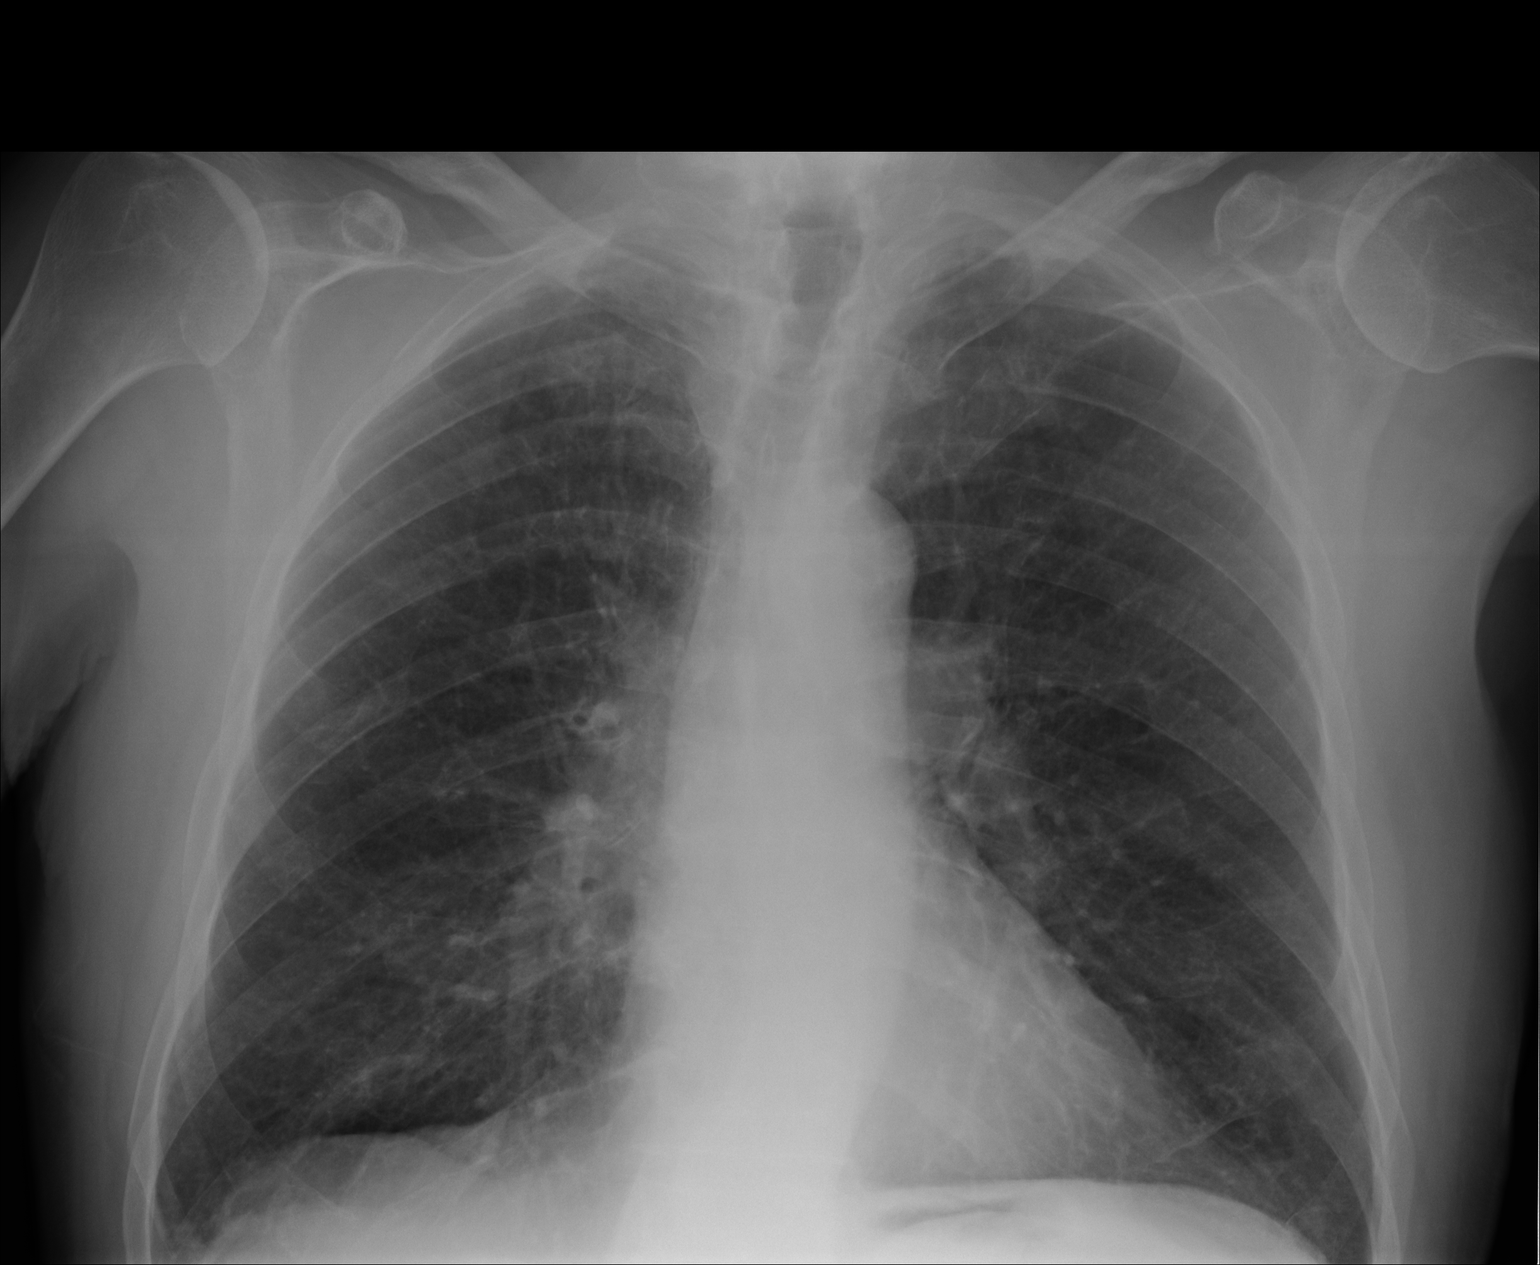
[im 2/3]
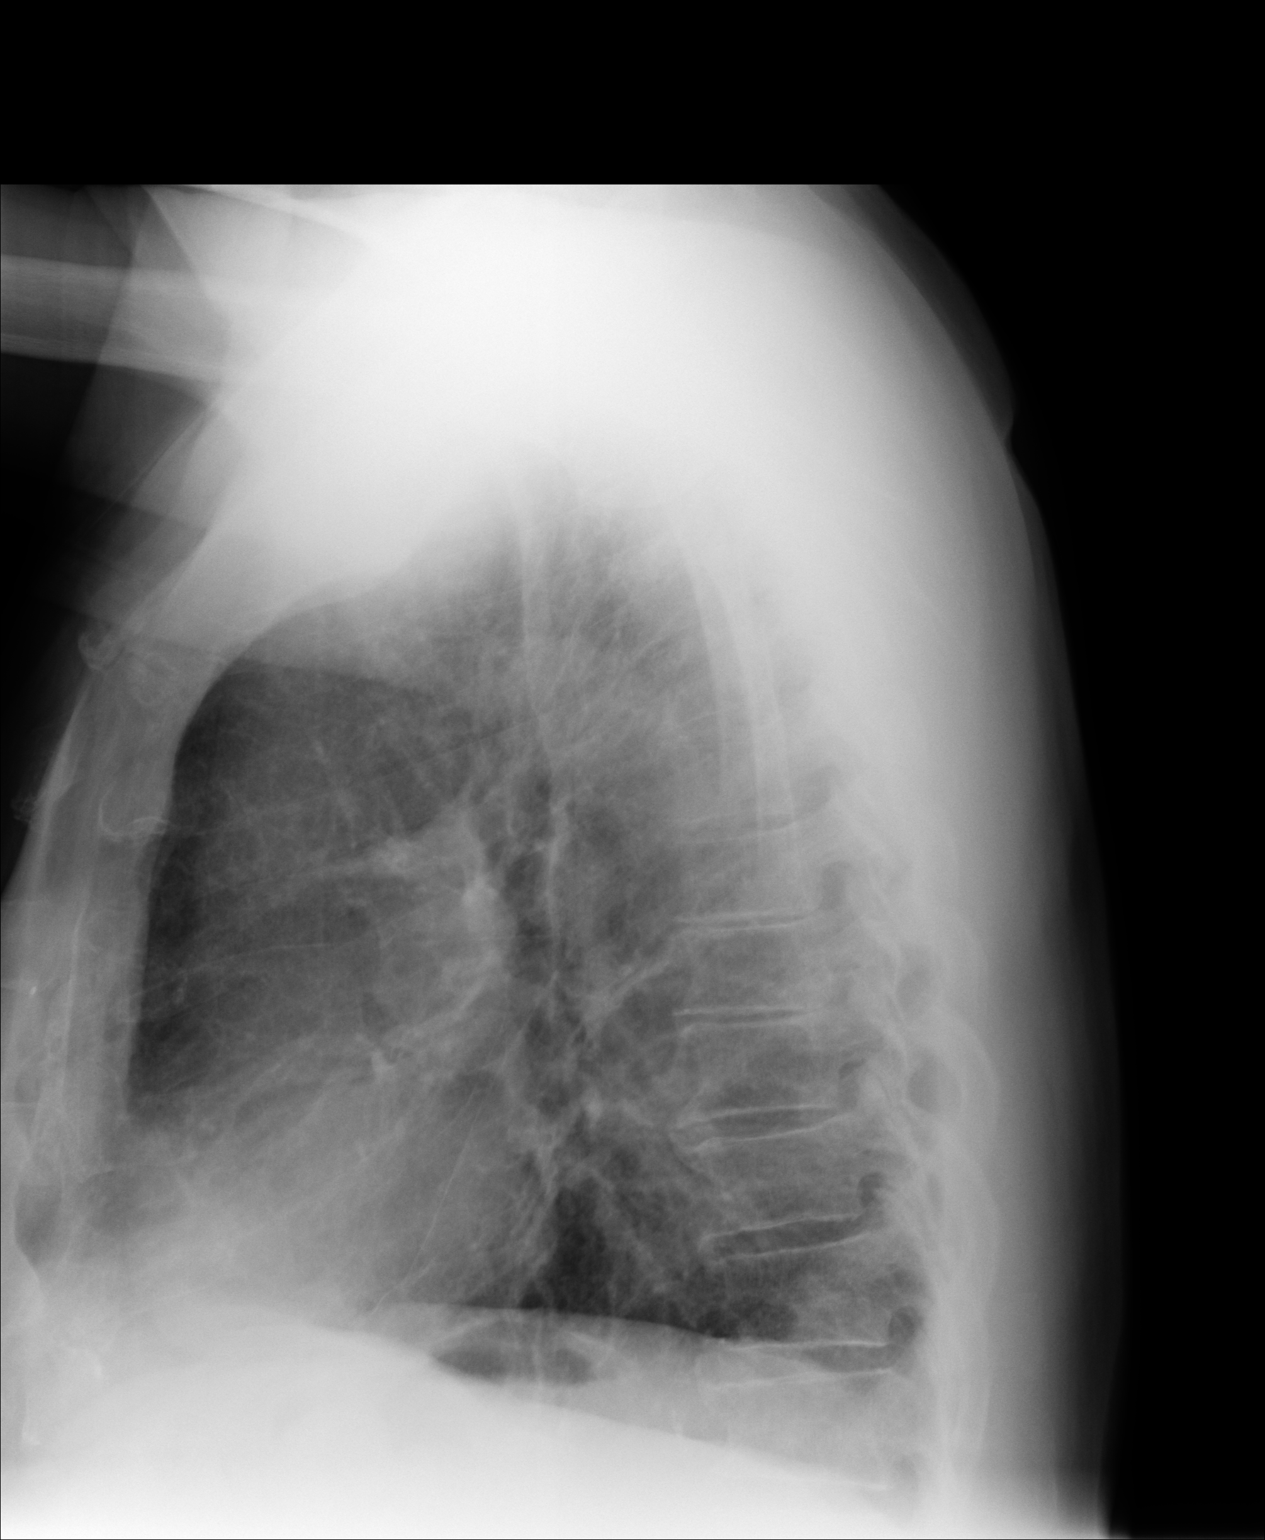
[im 3/3]
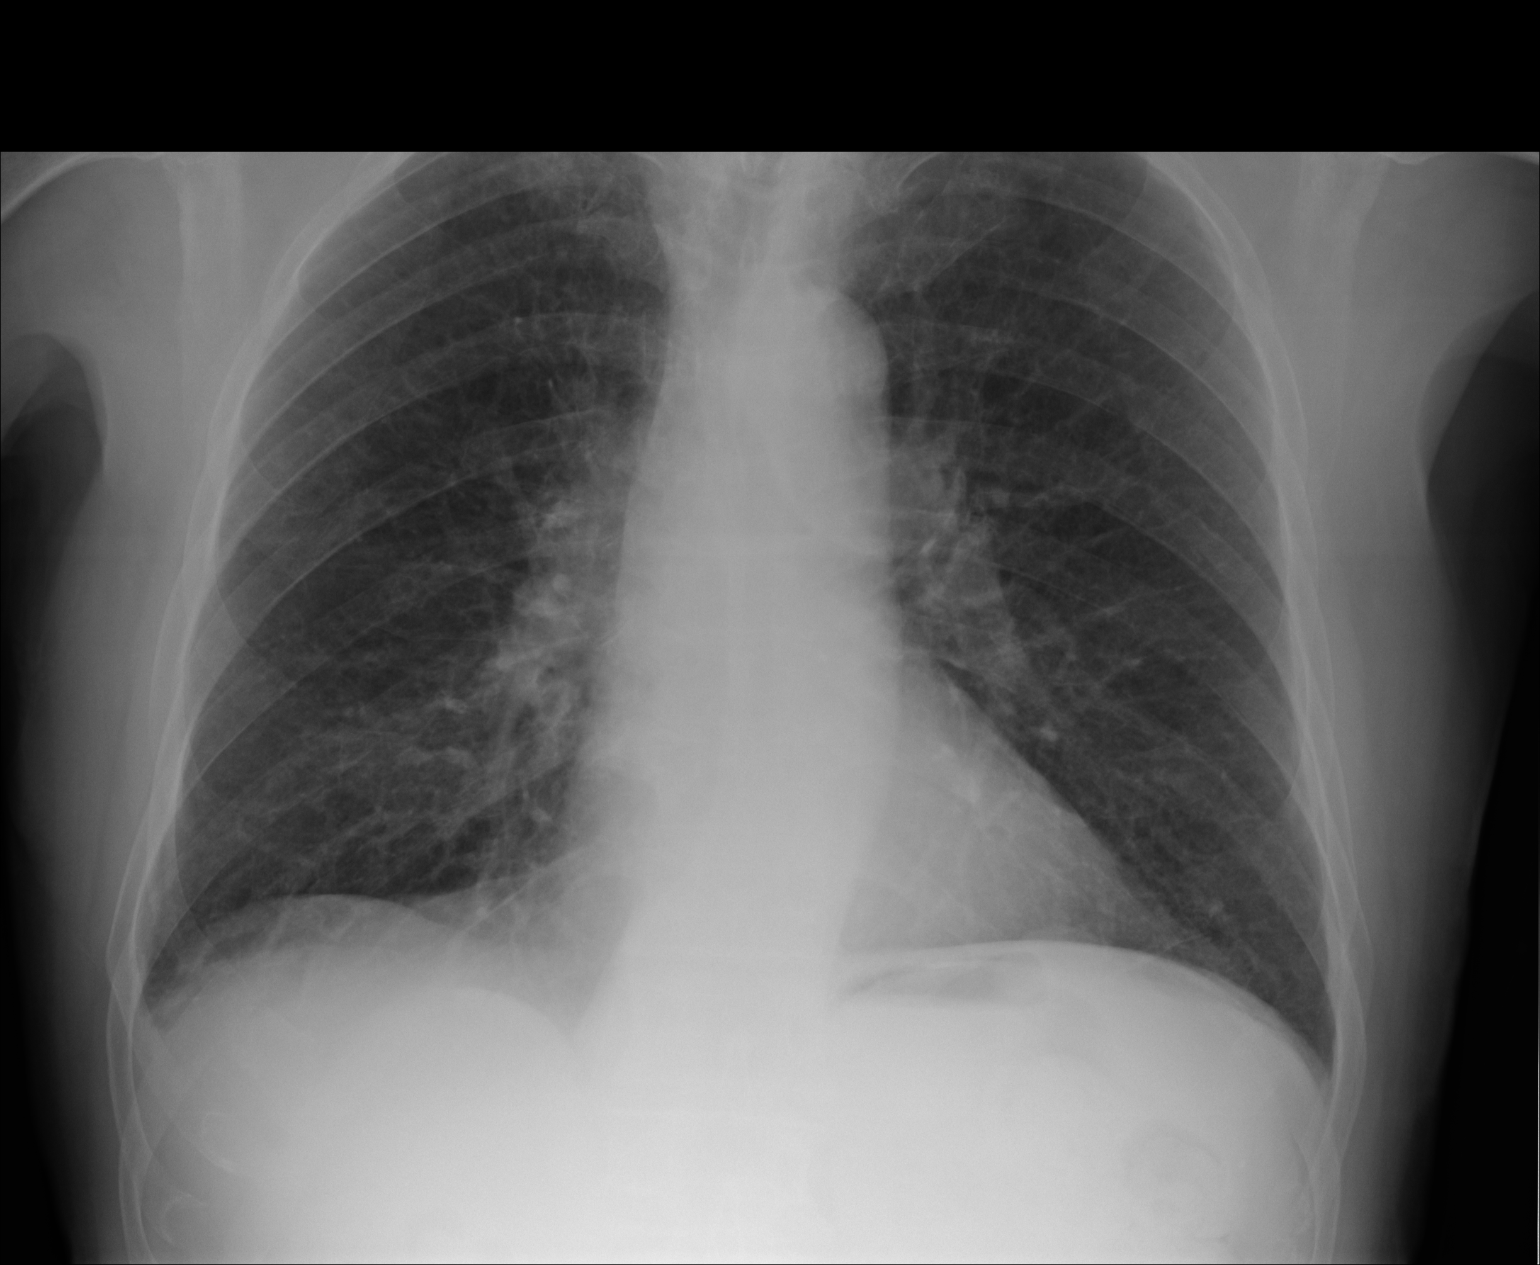

[3 of 3 positions shown; findings below may reference images not displayed]

PROCEDURE:     KDR - KDXR CHEST PA (OR AP) AND LAT  - July 07, 2011  [DATE]

RESULT:     Comparison is made to the study of 04/02/2011.

The lungs show hyperinflation. The lung markings are very coarse but
unchanged. No mass, effusion or pneumothorax is present. The bony structures
are unremarkable. The cardiac silhouette appears normal.
IMPRESSION: Findings which can be secondary to chronic inflammation and
fibrosis. No acute cardiopulmonary disease evident.

## 2013-10-13 IMAGING — CR DG CHEST 1V PORT
1 series · 1 of 1 positions shown · non-contrast
Comparison: none

REASON FOR EXAM: fever, [place] cough
COMMENTS:

[portable]
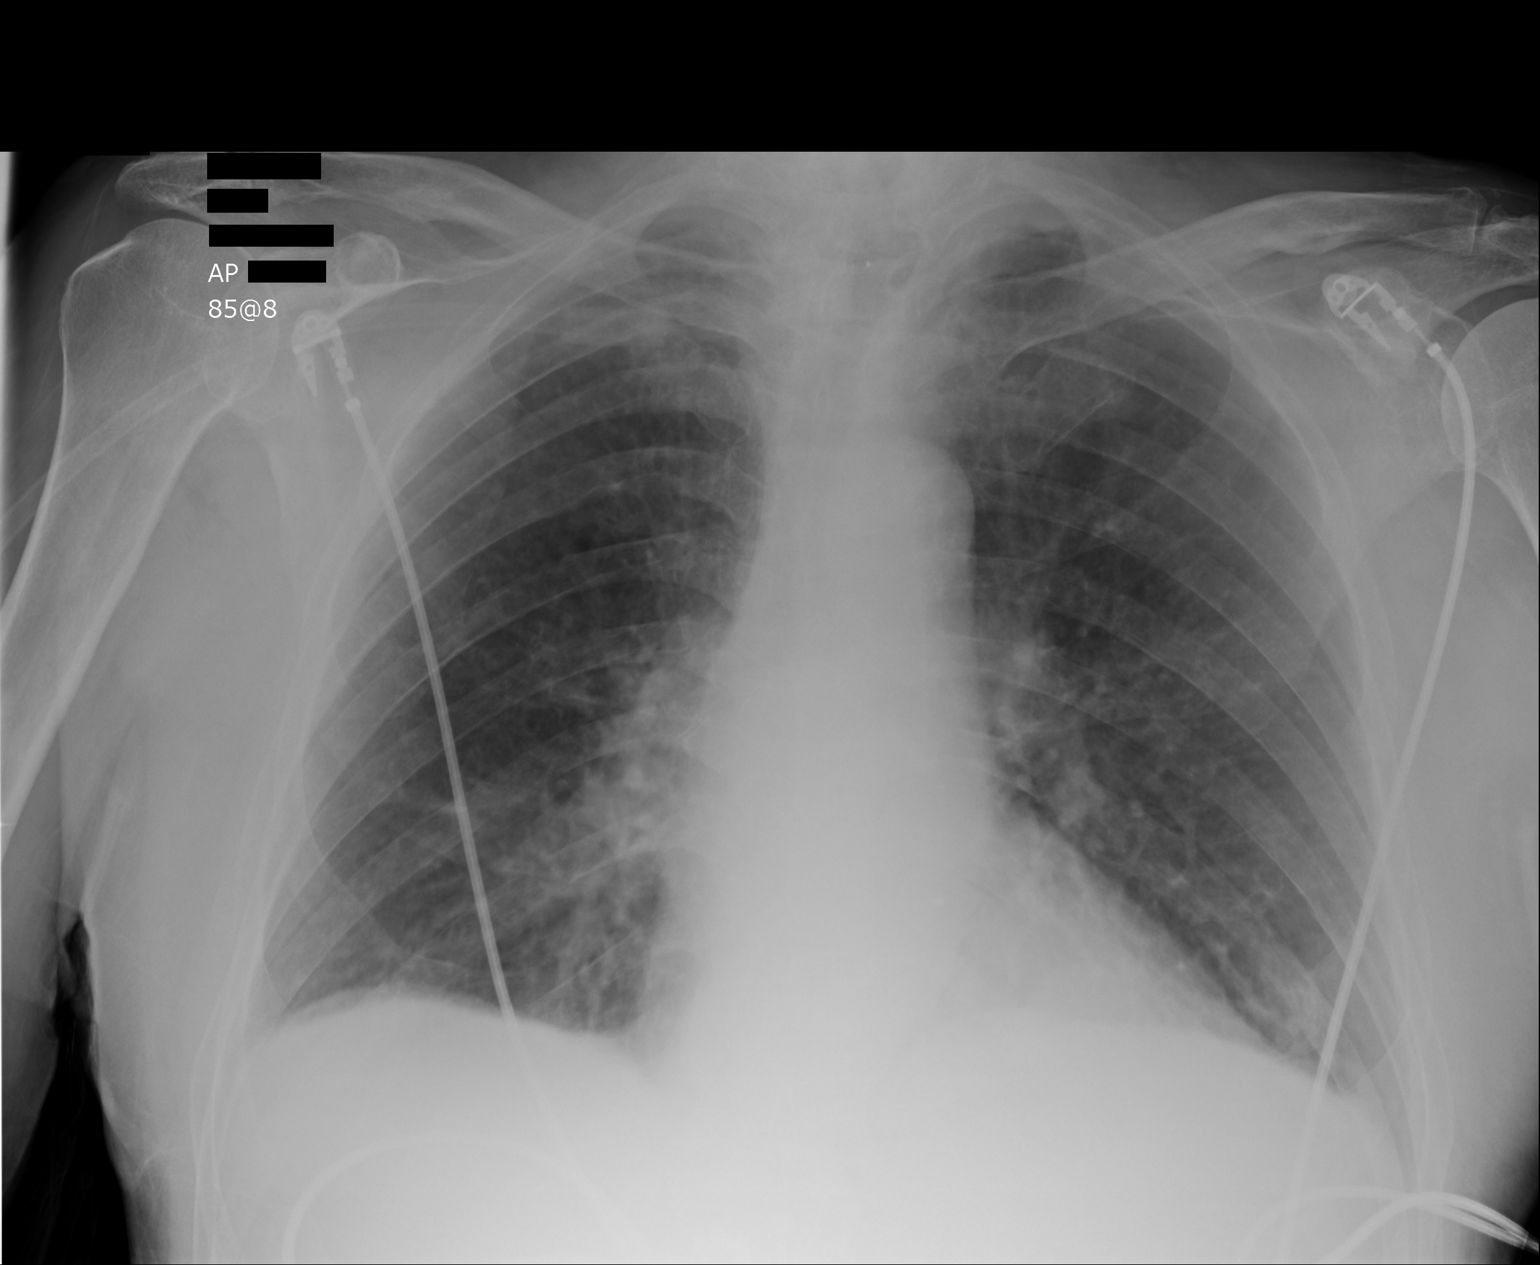

[1 of 1 positions shown; findings below may reference images not displayed]

PROCEDURE:     DXR - DXR PORTABLE CHEST SINGLE VIEW  - August 20, 2011 [DATE]

RESULT:     CT of the chest is compared to the study 07 July, 2011.

Prominent central pulmonary vasculature is present with mild interstitial
edema and basilar atelectasis versus infiltrate. Followup PA and lateral
views are recommended when the patient's condition permits. The heart is
normal in size. The lungs show hyperinflation. Monitoring electrodes are
present.
IMPRESSION: Pulmonary vascular congestion with mild interstitial edema.
Basilar areas of atelectasis or minimal infiltrate. Followup PA and lateral
views the chest would be helpful when the patient's condition permits.

## 2014-12-28 IMAGING — CR DG CHEST 2V
1 series · 2 of 2 positions shown · non-contrast
Comparison: none

REASON FOR EXAM: S/P Fall
COMMENTS:

PROCEDURE:     DXR - DXR CHEST PA (OR AP) AND LATERAL  - November 03, 2012  [DATE]
RESULT:     Comparison made to prior study of 11/01/2012. There has been
interim partial clearing of left lower lobe pneumonia. Heart size is stable.

[Series 1: x chest ap · 0.14mm/px · 2 of 2 slices shown]
[im 1/2]
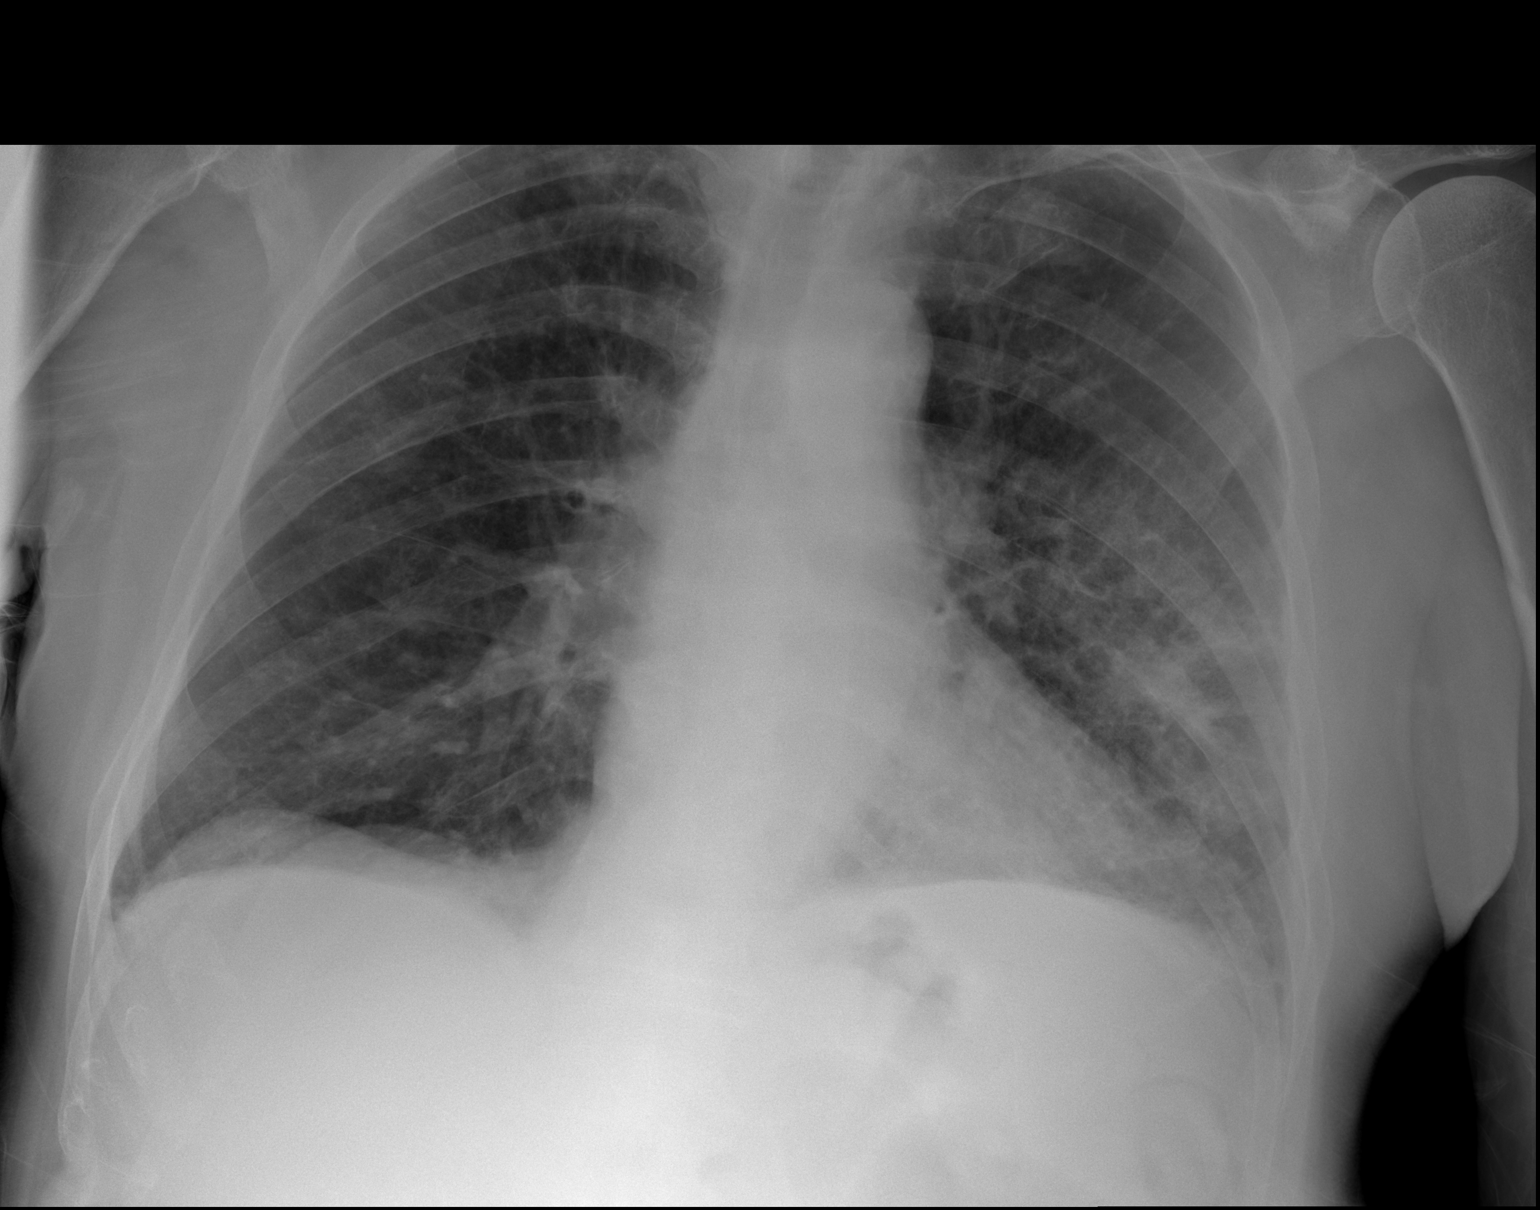
[im 2/2]
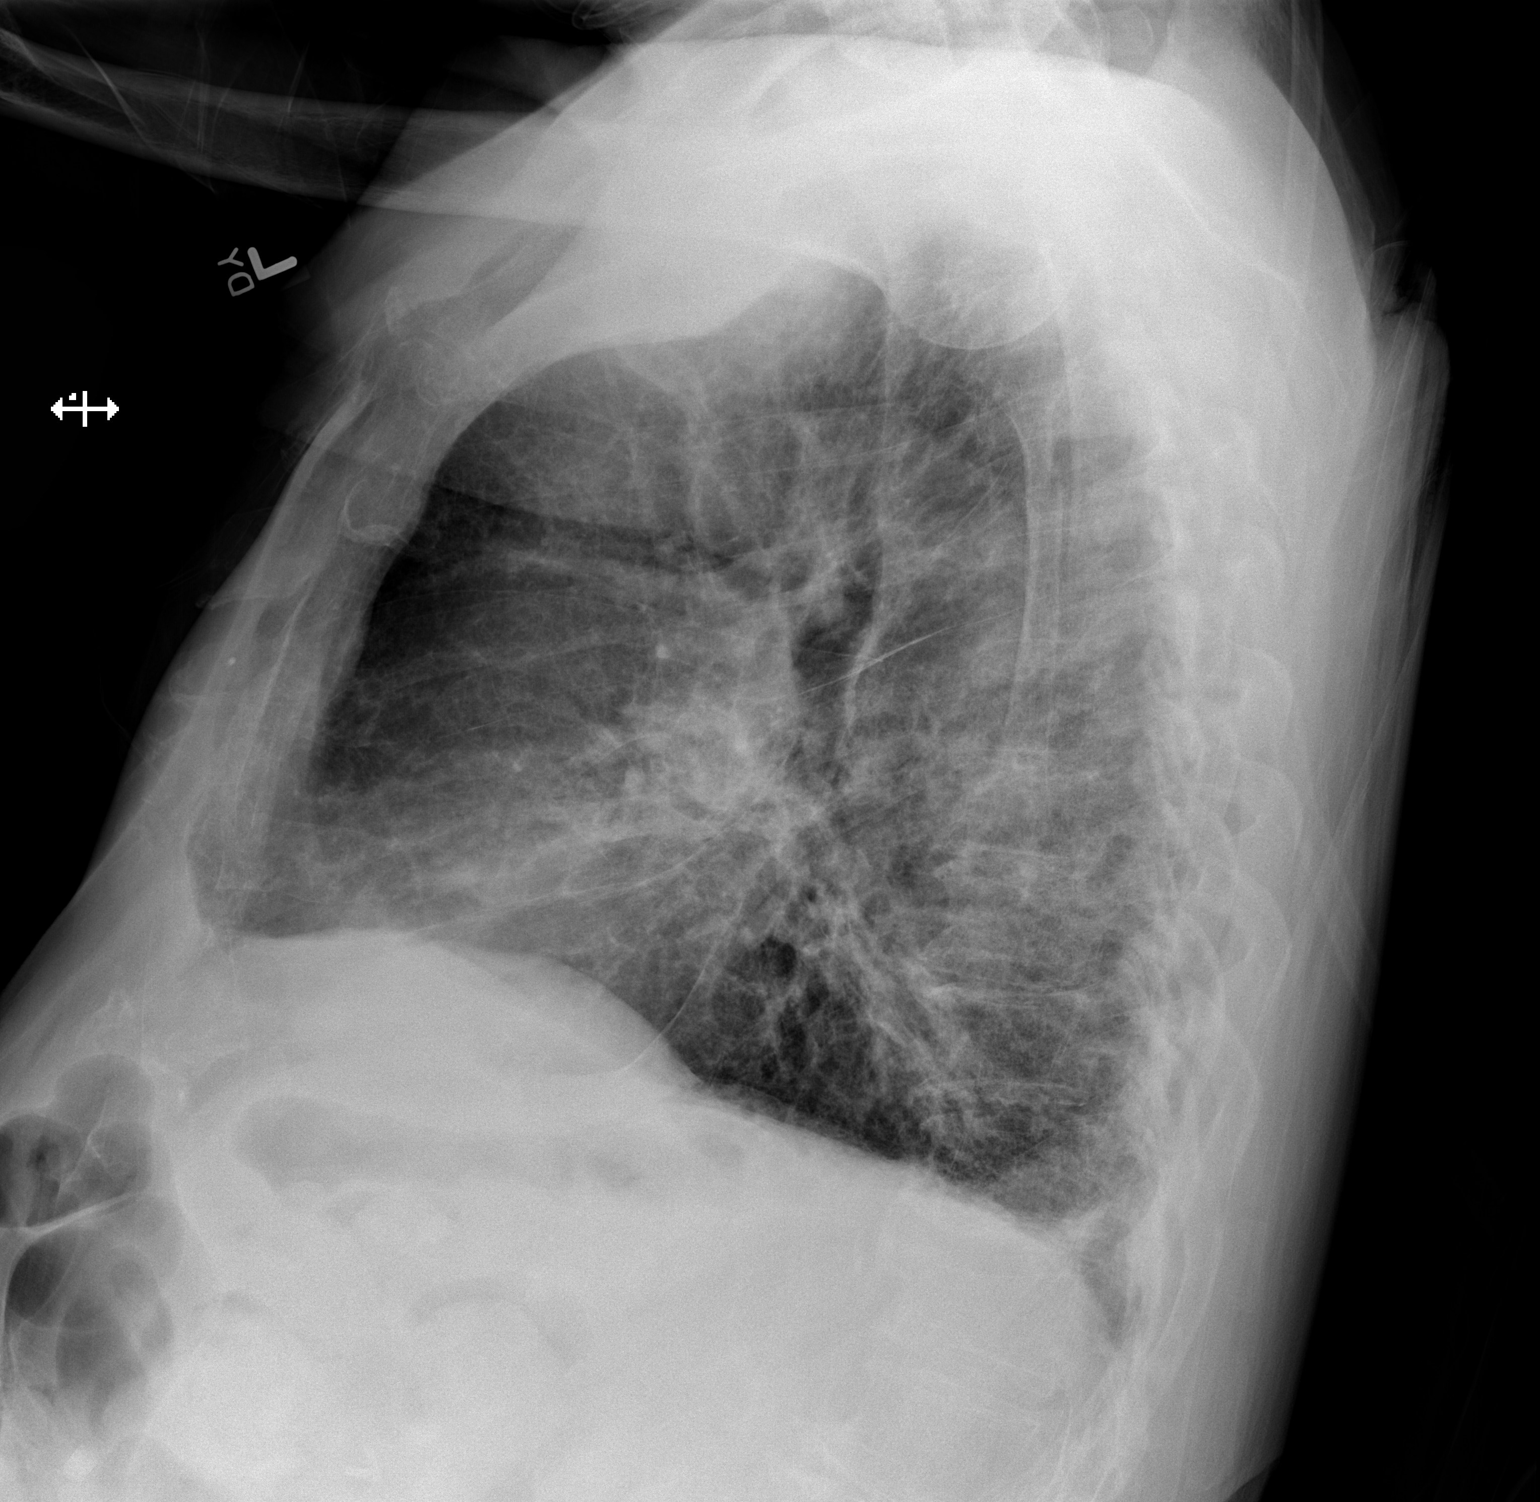

[2 of 2 positions shown; findings below may reference images not displayed]

IMPRESSION: Interim partial clearing of left lower lobe pneumonia.
Continued followup chest x-ray suggested. No other acute abnormalities
noted. No pneumothorax.

## 2014-12-28 IMAGING — CR DG KNEE COMPLETE 4+V*L*
1 series · 4 of 4 positions shown · non-contrast
Comparison: none

REASON FOR EXAM: Pain/ S/P Fall
COMMENTS:

PROCEDURE:     DXR - DXR KNEE LT COMP WITH OBLIQUES  - November 03, 2012  [DATE]
RESULT:     The small effusion. Degenerative change. No acute bony
abnormality.

[Series 1: x knee ap left · 0.14mm/px · 4 of 4 slices shown]
[im 1/4]
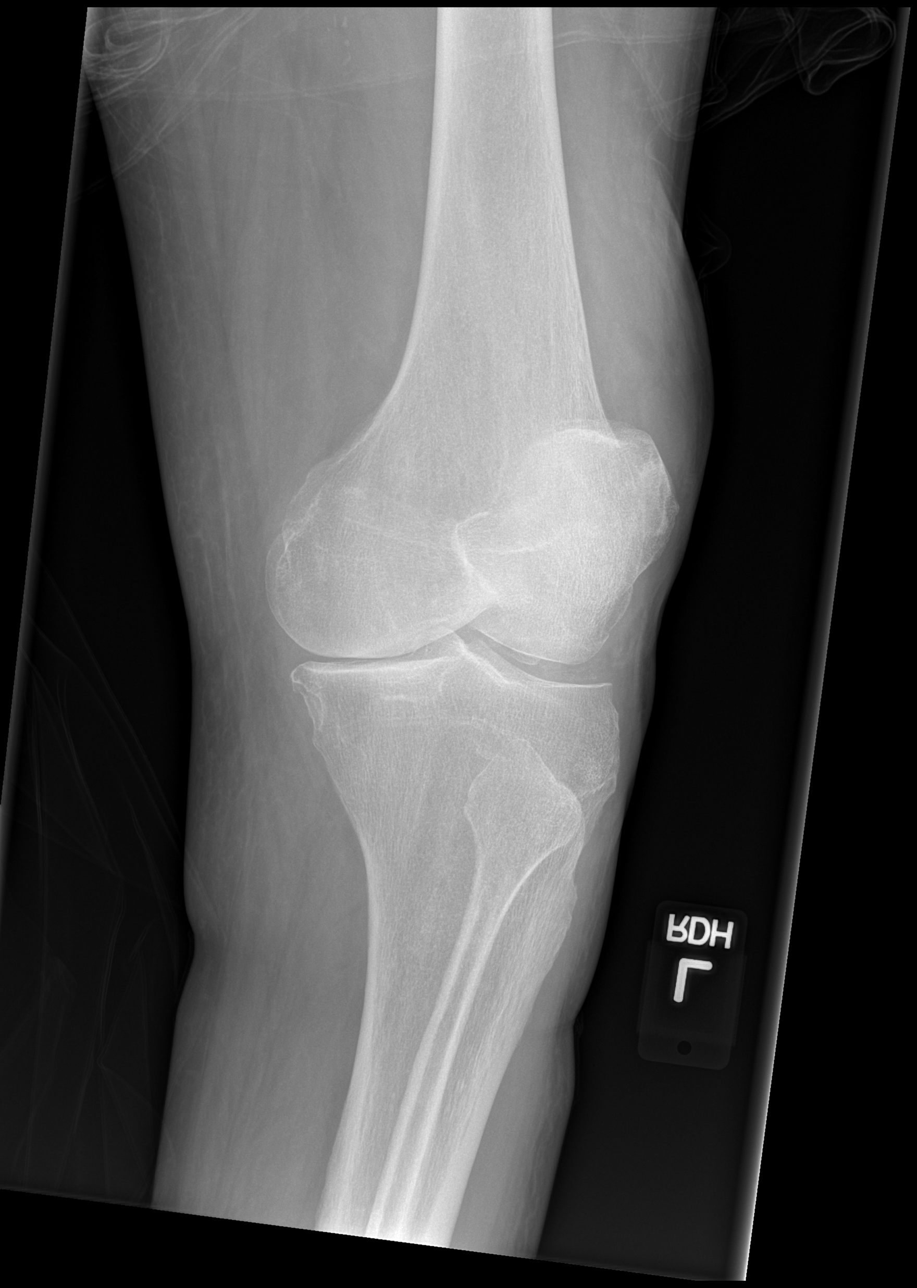
[im 2/4]
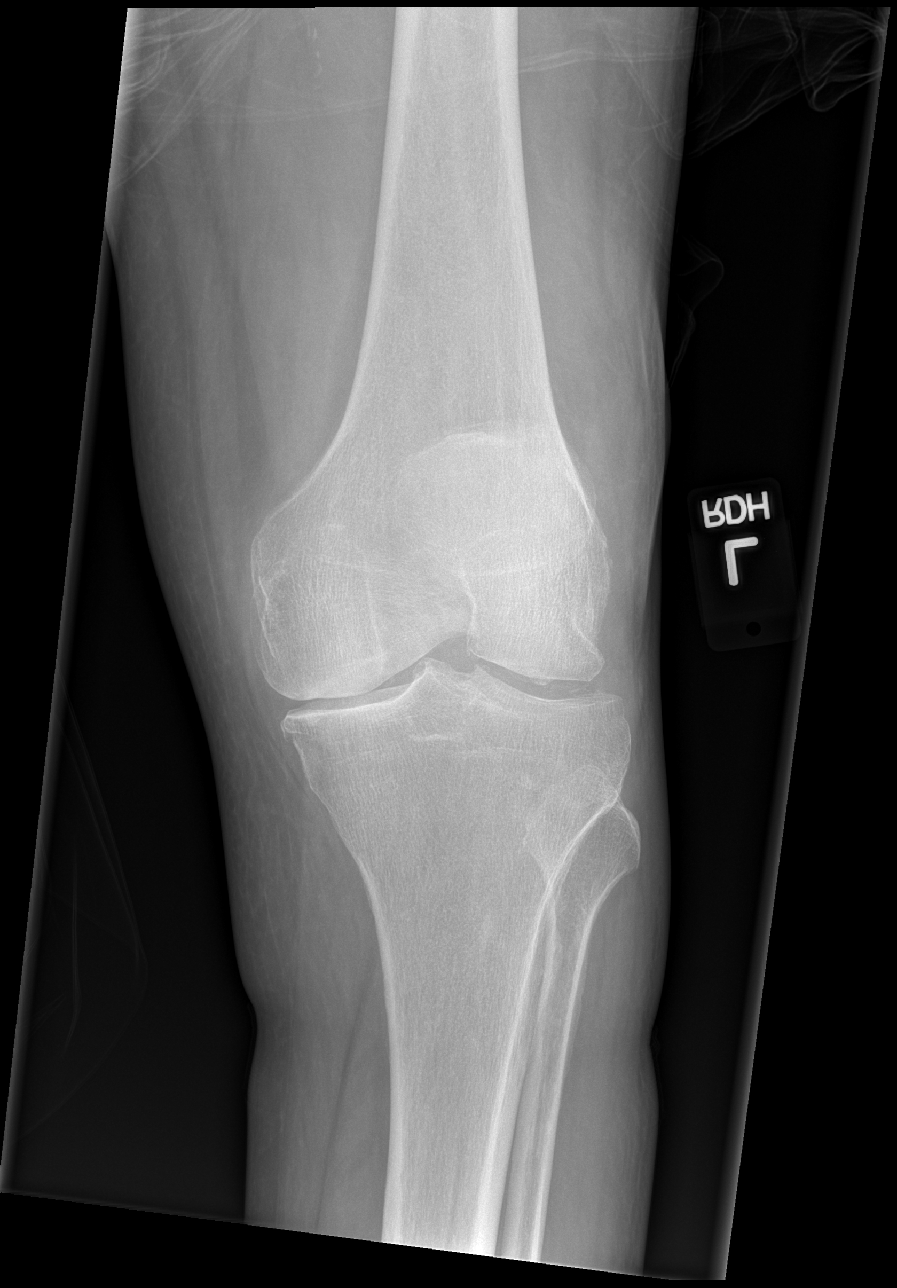
[im 3/4]
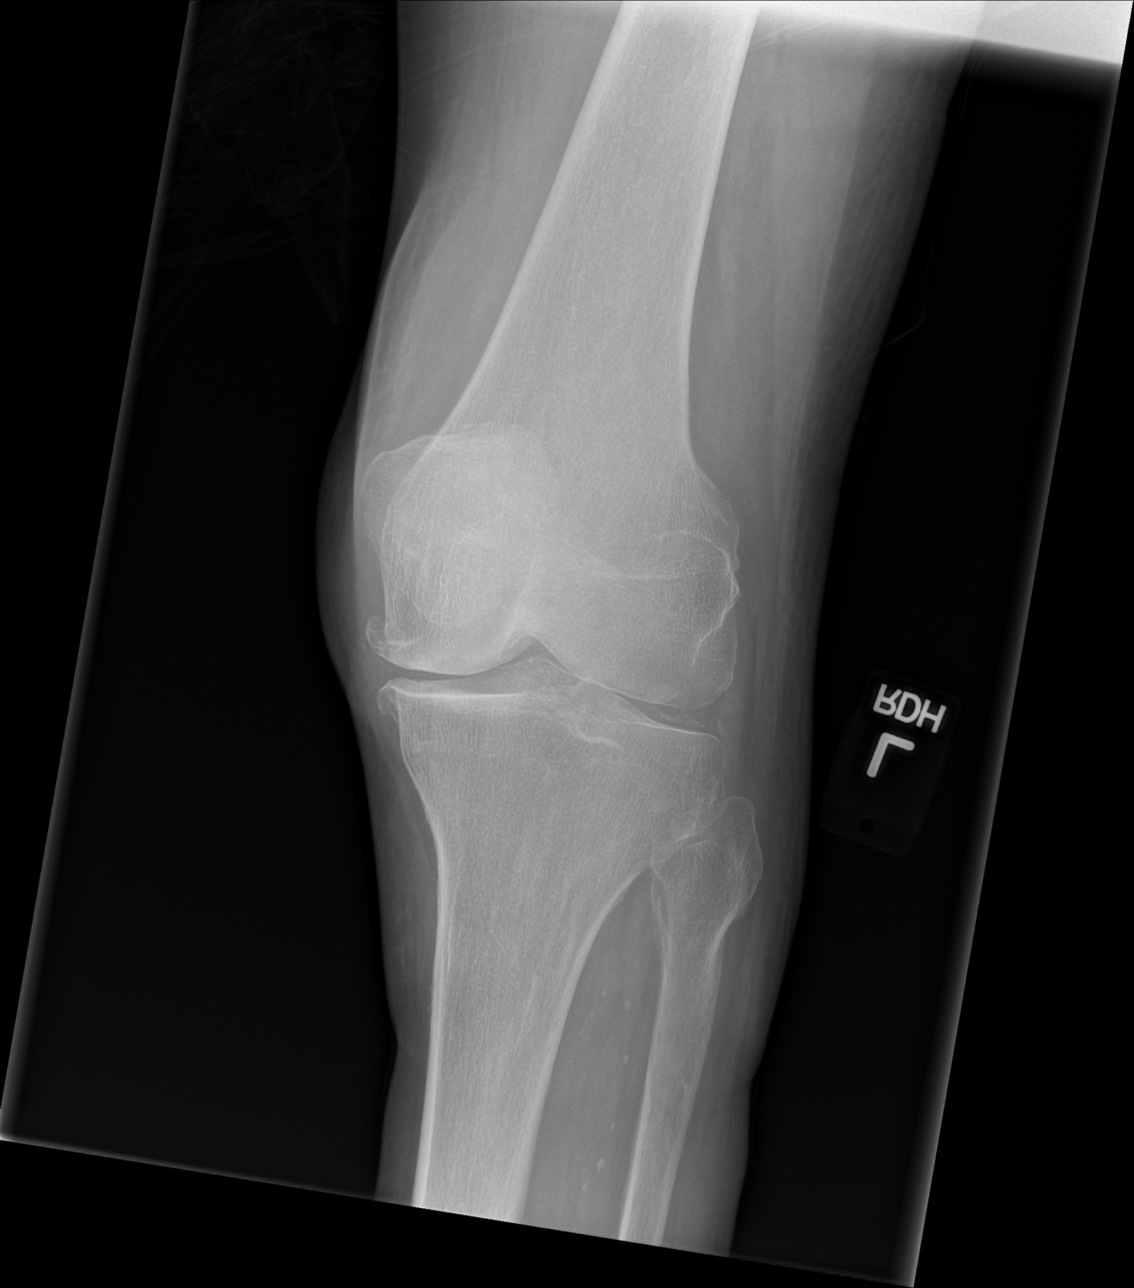
[im 4/4]
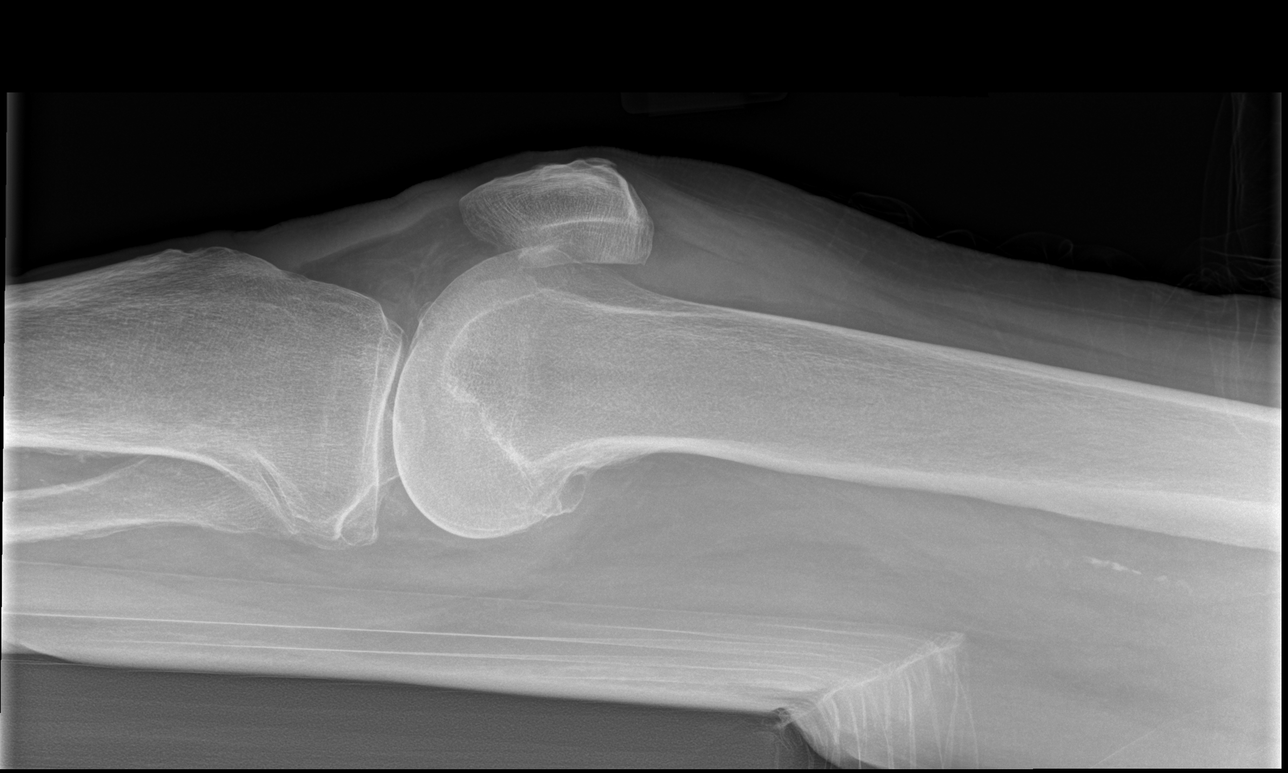

[4 of 4 positions shown; findings below may reference images not displayed]

IMPRESSION: No acute bony abnormalities. Small knee joint effusion.
Degenerative change.

## 2015-01-13 NOTE — H&P (Signed)
PATIENT NAME:  [name], [name] MR#:  [id number] DATE OF BIRTH:  March 20, 1921  DATE OF ADMISSION:  07/08/2012  PRIMARY CARE PHYSICIAN: Dr. Dewaine Oats    CHIEF COMPLAINT: Weakness and shortness of breath.   HISTORY OF PRESENT ILLNESS: This is an 81 year old male who presents to the Emergency Room due to shortness of breath, weakness, and failure to thrive. The patient said that he has been having some worsening shortness of breath over the past few days. He called his primary care physician, Dr. Dewaine Oats, and he was prescribed some Levaquin yesterday. He has been having some productive sputum green-yellow in color. According to his daughter, the patient also had a fever of 102 at home. The patient was not improving and, therefore, was brought to the ER. As per the daughter, the patient has also not been able to keep any fluids down and he feels increasingly weak over the past 24 hours. When he presented to the ER, the patient was noted to be audibly having significant audible wheezing and noted to be in COPD exacerbation. He was also noted to have suspected right-sided pneumonia. Hospitalist services were contacted for further treatment and evaluation.   REVIEW OF SYSTEMS: CONSTITUTIONAL: Positive fever of 102 at home. No weight gain, no weight loss. EYES: No blurry or double vision. ENT: No tinnitus, no postnasal drip, no redness of the oropharynx. RESPIRATORY: Positive cough. Positive wheeze. No hemoptysis. Positive dyspnea on exertion. Positive COPD. CARDIOVASCULAR: No chest pain, no orthopnea, no palpitations, no syncope. GI: No nausea, no vomiting, no diarrhea, no abdominal pain, no melena, no hematochezia. GU: No dysuria, no hematuria. ENDOCRINE: No polyuria or nocturia. No heat or cold intolerance. HEME: No anemia, no bruising, no bleeding. INTEGUMENTARY: No rashes, no lesions. MUSCULOSKELETAL: No arthritis, no swelling, no gout. NEUROLOGIC: No numbness, no tingling, no ataxia, no seizure-type  activity. PSYCH: No anxiety, no insomnia, no ADD.   PAST MEDICAL HISTORY:  1. Diabetes. 2. Chronic obstructive pulmonary disease, oxygen dependent. 3. Diabetic neuropathy.  4. History of previous CVA.  5. Hyperlipidemia.  6. Gastroesophageal reflux disease.  7. Constipation.   ALLERGIES: Aspirin, codeine, hydroxyzine, Lortab, Motrin, penicillin, sulfa drugs, and Tequin.   SOCIAL HISTORY: Does have a 40 to 50 pack year smoking history but quit 20 to 30 years ago. No alcohol abuse. No illicit drug abuse. Lives at home with his daughter.   FAMILY HISTORY: The patient's mother died from breast cancer. He cannot recall what his father died from.   CURRENT MEDICATIONS:  1. Multivitamin, Centrum, daily.  2. Fexofenadine 180 mg daily.  3. Gabapentin 300 mg daily.  4. Humalog 75/25 15 units b.i.d.  5. Lasix 20 mg daily as needed.  6. Mucinex 1200 mg daily.  7. Sublingual nitroglycerin as needed.  8. Novolin regular sliding scale. 9. Os-Cal with Vitamin D daily.  10. Protonix 40 mg daily.  11. Plavix 75 mg daily.  12. Senokot 1 tab daily.  13. Simvastatin 80 mg daily.  14. Ventolin inhaler 2 puffs q.4 to 6 hours.  15. Symbicort 2 puffs b.i.d.  16. MiraLAX daily.  17. Tylenol with hydrocodone 10/325 1 to 2 tabs t.i.d. as needed.   PHYSICAL EXAMINATION ON ADMISSION:   VITAL SIGNS: Temperature 99.3, pulse 76, respirations 20, blood pressure 130/60, sats 98% on 2 liters nasal cannula.   GENERAL: The patient is a pleasant appearing male in mild respiratory distress.   HEENT: Atraumatic, normocephalic. Extraocular muscles are intact. Pupils equal, reactive to light. Sclerae anicteric.  No conjunctival injection. No oropharyngeal erythema.   NECK: Supple. No jugular venous distention, no bruits, no lymphadenopathy, no thyromegaly.   HEART: Regular rate and rhythm, tachycardic. No murmurs, no rubs, no clicks.   LUNGS: He has some coarse breath sounds bilaterally. Negative use of  accessory muscles. Positive wheezing and diffuse rhonchi bilaterally.   ABDOMEN: Soft, flat, nontender, nondistended. Has good bowel sounds. No hepatosplenomegaly appreciated.   EXTREMITIES: No evidence of any cyanosis, clubbing, or peripheral edema. Has +2 pedal and radial pulses bilaterally.   NEUROLOGIC: The patient is alert, awake, and oriented x3 with no focal motor or sensory deficits appreciated bilaterally.   SKIN: Moist and warm with no rash appreciated.   LYMPHATIC: There is no cervical or axillary lymphadenopathy.   LABORATORY, DIAGNOSTIC, AND RADIOLOGICAL DATA: Serum glucose 129, BUN 15, creatinine 1.3, sodium 142, potassium 4.3, chloride 106, bicarb 27. LFTs are within normal limits. Troponin less than 0.02. White cell count 8.6, hemoglobin 12.8, hematocrit 37.2, platelet count 156.   The patient did have a chest x-ray done which showed a suspected right lower lobe infiltrate.   ASSESSMENT AND PLAN: This is an 81 year old male with history of COPD, oxygen dependent, hypertension, diabetes, diabetic neuropathy, hyperlipidemia, and gastroesophageal reflux disease who presented to the hospital with shortness of breath, weakness and fatigue and noted to be in COPD exacerbation likely secondary to a pneumonia.  1. Acute on chronic respiratory failure. This is likely secondary to COPD exacerbation. I will start the patient on IV steroids, continue around-the-clock nebulizer treatments, continue his Symbicort. Add some Spiriva. Will also treat him empirically for suspected pneumonia with meropenem, Levaquin, and vancomycin as he is allergic to penicillin. Follow him clinically.  2. COPD exacerbation. This is likely secondary to underlying pneumonia. Continue with IV steroids, around-the-clock nebulizer treatments. Continue Symbicort and add some Spiriva and empiric antibiotics as stated above. The patient is normally followed by Dr. Meredeth IdeFleming, the pulmonologist.  3. Pneumonia. This is likely  community-acquired but the patient is a high Pseudomonas  risk given his history of diabetes and COPD. The patient is penicillin allergic as mentioned. He has already been on Levaquin for a day. I will continue meropenem and IV Levaquin and also add vancomycin. Follow sputum and blood cultures.  4. Gastroesophageal reflux disease. I would continue with Protonix. 5. Diabetes. Continue insulin 75/25 and sliding scale insulin coverage. His sugars will likely tend to run high given the fact that he is going to be on IV steroids.  6. Diabetic neuropathy. Continue Neurontin.  7. Hyperlipidemia. Continue simvastatin.  8. Constipation. Continue with MiraLAX and Senokot.   CODE STATUS: The patient is a FULL CODE.   TIME SPENT WITH THE ADMISSION: 50 minutes.   ____________________________ Rolly PancakeVivek J. Cherlynn KaiserSainani, MD vjs:drc D: 07/08/2012 18:59:00 ET T: 07/09/2012 07:17:32 ET JOB#: [id number]  cc: Rolly PancakeVivek J. Cherlynn KaiserSainani, MD, <Dictator> Jillene Bucksenny C. Arlana Pouchate, MD [place] SirenVIVEK J SAINANI MD ELECTRONICALLY SIGNED 07/09/2012 13:47

## 2015-01-13 NOTE — Discharge Summary (Signed)
PATIENT NAME:  [name], [name] MR#:  [id number] DATE OF BIRTH:  [birth year]  DATE OF ADMISSION:  07/08/2012 DATE OF DISCHARGE:  07/13/2012  DISCHARGE DIAGNOSES:  1. Acute on chronic respiratory failure, now resolved, close to baseline, on oxygen at night and likely with ambulation.  2. Chronic obstructive pulmonary disease exacerbation, likely secondary to pneumonia, improving on steroids and antibiotic.  3. Right lower lobe pneumonia, improving on antibiotic. 4. Constipation, on stool softener. 5. Episodes of agitation, transient and resolved.   SECONDARY DIAGNOSES:  1. Diabetes. 2. Chronic obstructive pulmonary disease, oxygen dependent.  3. Diabetic neuropathy.  4. History of cerebrovascular accident.  5. Hyperlipidemia.  6. Gastroesophageal reflux disease.  7. Constipation.   CONSULTANTS: Physical Therapy.   PROCEDURES/RADIOLOGY: Chest x-ray on 07/08/2012 showed right lower lobe pneumonia.  Chest x-ray on 07/10/2012 showed minimal right upper lobe pneumonia.  Abdominal three-way with PA of chest on 07/12/2012 showed constipation. No overt evidence of pneumonia or congestive heart failure.   MAJOR LABORATORY PANEL: Urinalysis on 07/08/2012 was negative. Repeat urinalysis on 07/09/2012 was negative.   Sputum culture grew normal flora, on 07/08/2012.   Blood cultures x2 were negative, on 07/08/2012.   Repeat urinalysis on 07/12/2012 was negative.   HISTORY AND SHORT HOSPITAL COURSE: The patient is an 81 year old male with the above-mentioned medical problems who was admitted for acute on chronic respiratory failure thought to be secondary to chronic obstructive pulmonary disease exacerbation which was thought to be secondary to right lower lobe pneumonia. The patient was started on antibiotics and was slowly tapering off steroids. The patient was evaluated by physical therapy and was slowly improving. He was also found to have constipation and was started on aggressive stool  softener regimen.  He did have a bowel movement and was slowly improving and doing much better and was close to his baseline on 07/13/2012 and was discharged home in stable condition.   DISCHARGE PHYSICAL EXAMINATION:   VITAL SIGNS: On the date of discharge, his temperature was 97.9, heart rate 90 per minute, respirations 20 per minute, blood pressure 132/71 mmHg, and he was saturating 94% on room air. It was recommended to use 2 liters oxygen mainly at night and on ambulation as he was desaturating in low 80s on those events.   CARDIOVASCULAR: S1 and S2 normal. No murmurs, rubs, or gallops.   PULMONARY: Lungs are clear to auscultation bilaterally. No wheezes, rales, rhonchi, or crepitation.   ABDOMEN: Soft, benign.   NEUROLOGIC: Nonfocal examination. All other physical examination remained at baseline.   DISCHARGE MEDICATIONS:  1. Simvastatin 80 mg p.o. daily.  2. Plavix 75 mg p.o. daily. 3. Os-Cal with vitamin D 1 tablet p.o. daily.  4. Centrum Silver once daily.  5. Lasix 20 mg p.o. daily as needed.  6. Allegra 180 mg p.o. daily.  7. Protonix 40 mg p.o. daily. 8. Mucinex 1200 mg p.o. daily. 9. Novolin R sliding scale. 10. Humalog mix 75/25 15 units subcutaneous twice a day. 11. Ventolin HFA 2 puffs inhaled every 4 to 6 hours.  12. Nitrostat 0.4 mg sublingual as needed.  13. Gabapentin 300 mg p.o. daily.  14. Docusate/senna 1 tablet p.o. twice a day. 15. Prednisone 60 mg p.o. daily taper by 10 mg daily until finished.  16. Polyethylene glycol 17 grams once daily as needed.  17. Levaquin 750 mg p.o. daily for one more day.   DISCHARGE DIET: Low sodium, 1800 ADA.   DISCHARGE ACTIVITY: As tolerated.   DISCHARGE INSTRUCTIONS AND FOLLOW-UP:  The patient was instructed to follow-up with his primary care physician, Dr. Dewaine Oats, in 1 to 2 weeks. He was set up to get physical therapy, home health nurse and nursing aide at home. He was instructed to use 2 liters oxygen via nasal  cannula at night and on ambulation.   TOTAL TIME DISCHARGING PATIENT: 55 minutes. ____________________________ Ellamae Sia. Sherryll Burger, MD vss:slb D: 07/15/2012 19:07:16 ET T: 07/16/2012 11:04:53 ET JOB#: [id number]  cc: Delisha Peaden S. Sherryll Burger, MD, <Dictator> Jillene Bucks. Arlana Pouch, MD Ellamae Sia [facility] MD ELECTRONICALLY SIGNED 07/17/2012 16:16

## 2015-01-16 NOTE — Consult Note (Signed)
Brief Consult Note: Diagnosis: Capacity evaluation.   Patient was seen by consultant.   Comments: I was unable to have a meaningful interaction with the patient today. I understand from Dr. Ernestene KielPhifer's note that the whole family is against DNR.  PLAN: George LundWil attempt to meet the patient tomorrow.  Electronic Signatures: Kristine LineaPucilowska, Jolanta (MD)  (Signed 10-Feb-14 18:07)  Authored: Brief Consult Note   Last Updated: 10-Feb-14 18:07 by Kristine LineaPucilowska, Jolanta (MD)

## 2015-01-16 NOTE — Consult Note (Signed)
   Comments   Called by RN at family's request. Asked if I would come speak with daughter, Hassan Rowan, who was not present during our previous visit. I met with Hassan Rowan, updated her on pt's status including results of the swallowing evaluation. Daughters understand the significance of pt's swallowing eval. Both say that they do not want pt to have a feeding tube. However, they have spoken with their brother in [place] who is adamant that pt have a feeding tube. Daughters ask that I meet with son when he arrives to discuss further. Unclear when son might arrive.   Electronic Signatures: Terrill Alperin, Izora Gala (MD)  (Signed 18-Mar-14 17:44)  Authored: Palliative Care   Last Updated: 18-Mar-14 17:44 by Sabrena Gavitt, Izora Gala (MD)

## 2015-01-16 NOTE — Discharge Summary (Signed)
PATIENT NAME:  [name], [name] MR#:  [id number] DATE OF BIRTH:  Mar 19, 1921  DATE OF ADMISSION:  09/28/2012 DATE OF DISCHARGE:  10/03/2012  PRIMARY CARE PHYSICIAN: Dr. Arlana Pouchate.  CONSULTING PHYSICIAN: Dr. Harvie JuniorPhifer.   FINAL DIAGNOSES:   1.  Acute on chronic respiratory failure.  2.  Chronic obstructive pulmonary disease exacerbation.  3.  Leukocytosis.  4.  Right-sided bacterial pneumonia.  5.  Acute renal failure.  6.  Metabolic encephalopathy.  7.  Diabetes.   CONDITION: Stable.   CODE STATUS: Full code.   HOME MEDICATIONS: Please [facility].  1.  The patient needs prednisone taper from 60 mg to 10 mg daily.  2.  In addition, the patient needs Levaquin 500 mg p.o. daily for 5 days.  3.  The patient needs home oxygen 2 liters by nasal cannula.   DIET: ADA diet.   ACTIVITY: As tolerated.   FOLLOWUP CARE: Follow up with PCP within 1 to 2 weeks. The patient will be resumed on hospice care at home and also needs a PT evaluation at home.   REASON FOR ADMISSION: Shortness of breath.   HOSPITAL COURSE: The patient is an 81 year old male with a history of end-stage COPD on home oxygen, presented to the ED with progressive shortness of breath for 24 hours. For detailed history and physical examination, please refer to the admission note dictated by Dr. Marc Morganskafor. On admission date, the patient had BUN of 21, creatinine increased at 2.34 from his baseline of 1.11. The patient's ABG showed pH of 7.28, pCO2 of 53 with a pO2 of 520 on FiO2 100% on BiPAP. EKG showed sinus tachycardia at 110 BPM. Chest x-ray showed mild atelectasis, right lung base. The patient was admitted for COPD exacerbation and questionable pneumonia. After admission, the patient was treated with BiPAP and with steroid, nebulizer and Levaquin treatment. The patient was finally weaned off BiPAP and placed on oxygen by nasal cannula 2 liters. The patient's symptoms  have much improved after the above-mentioned treatment.  1.  Acute respiratory failure has improved. However, the patient still has leukocytosis, about 14, which is possibly due to steroid.  2.  For acute renal failure, the patient's Lasix was on hold and was treated with IV fluid support. Creatinine decreased to normal range. Her BUN is 33.  3.  Altered mental status due to metabolic encephalopathy related to underlying hypercarbic respiratory failure has improved to the patient's baseline.  4.  Diabetes: The patient has been treated with sliding scale insulin, 75/25, which is stable.  Today, the patient is minimal alert, awake, oriented. On physical examination, her lungs are clear. Vital signs are stable. The patient is clinically stable and will be discharged to home with hospice care.   Discussed the patient's situation and discharge plan with the patient, the patient's family member and case Production designer, theatre/television/filmmanager.   TIME SPENT: About 38 minutes.    ____________________________ Shaune PollackQing Shafin Pollio, MD qc:jm D: 10/03/2012 16:33:19 ET T: 10/03/2012 21:36:53 ET JOB#: [id number]  cc: Shaune PollackQing Janaa Acero, MD, <Dictator> Shaune PollackQING Jeraldin Fesler MD ELECTRONICALLY SIGNED 10/05/2012 15:18

## 2015-01-16 NOTE — Discharge Summary (Signed)
PATIENT NAME:  [name], [name] MR#:  [id number] DATE OF BIRTH:  19-Mar-1921  DATE OF ADMISSION:  12/02/2012 DATE OF DISCHARGE:  12/14/2012  Please refer to interim discharge summary dictated 2 days ago by Dr. Luberta MutterKonidena, which specified most of the issues around this hospitalization.  MEDICATIONS AT DISCHARGE:  1. Albuterol/Atrovent nebulizers p.r.n. shortness of breath.  2. Mucinex 1200 mg every 12 hours.  3. Bisacodyl 5 mg delayed release tablets once a day.  4. Centrum Silver once a day.  5. Levothyroxine 125 mcg once a day. 6. Os-Cal calcium with vitamin D once a day.  7. Nitrostat 0.4 mg p.r.n. chest pain.  8. Omeprazole 20 mg once a day. 9. Gabapentin 300 mg once a day. 10. Plavix 75 mg once a day. 11. Symbicort 160/4.5 two puffs twice daily.  12. Docusate 100 mg twice a day.  13. Polyethylene glycol 17 grams once a day.  14. Lasix 80 mg once a day. 15. Claritin 10 mg once a day. 16. Ventolin 90 mcg 2 puffs every 4 hours p.r.n.  17. Simvastatin 40 mg once a day.  18. Prednisone 40 mg every day, decrease by 10 mg every 2 days until prescription is finished. 19. Acetaminophen p.r.n. fever.  20. Morphine 20 mg/mL take 0.25 mL every 4 hours as needed for pain. 21. Norco 325/10 mg every 6 hours p.r.n. pain.  22. Humalog sliding scale.  23. Insulin Lispro 75/25 units take 22 units subcutaneously twice daily with meals.  24. Scopolamine 1 patch topically 3 times a day. 25. Nystatin 100,000 units every 6 hours oral suspension.  26. Haloperidol 0.5 mg once a day at bedtime. 27. Lorazepam 0.5 mg every 4 hours for agitation.  28. Potassium chloride 20 mEq once a day.  29. Amoxicillin 1000 mg every 12 hours for 5 days.  30. Linezolid 600 mg every 12 hours for 5 days.  31. Dextromethorphan 10 mL as needed for cough. 32. Ensure 1 can twice daily.   FOLLOWUP INSTRUCTIONS: The patient to follow up with Dr. [name] in 1 to 2 weeks.   HOSPITAL COURSE: Again, please refer to interim  discharge summary dictated by Dr. Luberta MutterKonidena 2 days ago. From the last couple of days, the patient has been evaluated by palliative care. The family has decided to go full code despite the fact that the patient is very appropriate for hospice care at this moment. He has severe dementia. He is not eating. He cannot swallow without aspirating. He had silent aspiration with his last MBSS. He is at high risk of aspiration pneumonia. At this moment, the patient is a full code as per family's desire. There have been a lot of issues regarding the power of attorney and who is going to take care of the patient, but they decided to do a skilled nursing facility now. The patient's son wants a PEG tube, the 2 daughters do not, for which we are just going to send to the nursing home and get more ST/speech therapy, and if he continues to fail, they will decide if he is going to have or not the PEG tube, but that can be done outpatient.  As far as medical problems: 1. Acute on chronic respiratory failure. The patient has significant bronchitis. His bronchitis has been treated with broad-spectrum antibiotics. He was switched to Zyvox and aztreonam for the past couple of days, and he has improved a little bit his secretions, but he remains significantly ill.  2. Hypokalemia is stable.  3. His leukocytosis was secondary to steroids. We are tapering off the steroids.  4. His diabetes has been worsened by steroids, for which his medications were increased, 75/25 was increased to 22 units twice daily.  5. He has coronary artery disease for which he is on Plavix and Zocor. 6. His Synthroid is working for his hypothyroidism.   As far as other medical problems, they were stable during this hospitalization.            TIME SPENT: I spent about 45 minutes with this discharge, a long conversation with the daughter. They still want him to be full code.    ____________________________ Felipa Furnace,  MD rsg:OSi D: 12/14/2012 10:30:00 ET T: 12/14/2012 11:13:42 ET JOB#: [id number]  cc: Felipa Furnace, MD, <Dictator> Elzena Muston Juanda Chance MD ELECTRONICALLY SIGNED 12/21/2012 13:33

## 2015-01-16 NOTE — H&P (Signed)
PATIENT NAME:  [name], [name] MR#:  [id number] DATE OF BIRTH:  [birth year]  DATE OF ADMISSION:  11/03/2012  PRIMARY CARE PHYSICIAN: Jillene Bucks. Arlana Pouch, MD  PULMONOLOGIST: Clenton Pare Meredeth Ide, MD  CARDIOLOGIST: Lamar Blinks, MD  CHIEF COMPLAINT: Status post fall, cough, increase in confusion.   HISTORY OF PRESENT ILLNESS: The patient is an 81 year old white male with history of chronic respiratory failure, is on 2 liters of O2. Also has diabetes, has history of coronary artery disease with 2 stents, who according to his daughter was in the hospital in January apparently with respiratory failure. She states that he almost died while being on the BiPAP machine. He was discharged home with hospice; however, hospice services have been discontinued per family's request because they were not satisfied apparently with the care. The patient had recently been diagnosed with a UTI and was treated with Cipro and then a few days ago, he started having coughing and shortness of breath. He thought he had pneumonia, came to the ED and was diagnosed with pneumonia and discharged home on p.o. Levaquin. At home, he resides with his daughter and they report that he has had progressive confusion over the past few months since "he has been sick." The patient earlier today was sitting on a commode. When they tried to get him off, he fell. He started complaining of pain in the left elbow and left knee. He also had skin abrasions to these areas, so he was brought to the ED. In the ED, again there is a left lower lobe infiltrate that was demonstrated. We are asked to admit the patient for failure of outpatient antibiotics as well as fall and progression of confusion.   REVIEW OF SYSTEMS: The patient is not able to give me any appropriate review of systems. His daughter is at bedside and provided me with his history. The patient does have chronic problem with itching and severe dry skin. He has not complained of any chest pains,  has not complained of any abdominal pain, nausea, vomiting or diarrhea. He has been able to eat okay. She does report that they have noticed that his ankles have been swollen more and his hands were swollen 2 days ago.   PAST MEDICAL HISTORY:  1.  Advanced COPD, is on chronic 2 liters of O2.  2.  Diabetes type 2.  3.  Coronary artery disease with MI with 2 stents. 4.  History of thyroid cancer, status post thyroidectomy.  5.  GERD.  6.  Hyperlipidemia.  7.  Eczema with dry skin.  8.  Dementia.  9.  Hypokalemia.   PAST SURGICAL HISTORY:  1.  Status post thyroidectomy.  2.  Status post lower back surgery.  3.  Status post cholecystectomy.   ALLERGIES: TO MULTIPLE MEDICATIONS INCLUDING PENICILLIN, SULFA, ASPIRIN, SPIRIVA AND CODEINE.   CURRENT MEDICATIONS AT HOME: DuoNebs q.6 p.r.n., MiraLax 17 grams p.o. daily, haloperidol 0.5 p.o. at bedtime, gabapentin 300 p.o. at bedtime, Lasix 20 daily, simvastatin 80 daily, Plavix 75 p.o. daily, levothyroxine 125 mcg daily, fexofenadine 180 daily, omeprazole 20 daily, multivitamin daily, Os-Cal daily, stool softener daily, Mucinex 1 tab p.o. b.i.d., Symbicort b.i.d., Humalog sliding scale insulin, Ventolin p.r.n., Robitussin p.r.n. He is also on a Protopic cream that he uses at home (strength of this is not known).   SOCIAL HISTORY: Does not smoke. Does not drink. No drugs. Lives with his daughter.   REVIEW OF SYSTEMS: Very limited. The patient is not a good historian.  CONSTITUTIONAL: According to daughter, he has not had any fevers per se. Has fatigue and weakness. Was complaining of pain in his left arm and left leg earlier, now resolved. No weight loss, weight gain.  EYES: No blurred or double vision. No redness. No pain. No inflammation.  ENT: No difficulty with swallowing. No tinnitus. No ear pain. No epistaxis.  RESPIRATORY: Cough, chronic COPD.  CARDIOVASCULAR: Denies any chest pain. Does have edema. No arrhythmia. No syncope.   GASTROINTESTINAL: No nausea, vomiting, diarrhea. No abdominal pain. No hematemesis.  GENITOURINARY: Denies any dysuria, hematuria, renal calculus or frequency.  ENDOCRINE: Denies any polyuria, nocturia. Does have hypothyroidism.  HEMATOLOGIC AND LYMPHATIC: Denies any major bruisability or bleeding.  SKIN: Does have chronic eczema with dry skin. Sees a dermatologist.  MUSCULOSKELETAL: Has pain related to osteoarthritis. No gout.  NEUROLOGIC: No CVA. No TIA. No seizures.  PSYCHIATRIC: Does have a history of anxiety and some insomnia.   PHYSICAL EXAMINATION:  VITAL SIGNS: Temperature 97.8, pulse 90, respirations 16, blood pressure 127/56, O2 sat 92 on 2 liters.  GENERAL: The patient is a very frail elderly male, currently not in any acute distress.  HEENT: Head atraumatic, normocephalic. Pupils equally round, reactive to light and accommodation. There is no conjunctival pallor. No scleral icterus. Nasal exam shows no drainage or ulceration. Oropharynx is clear without any exudate.  NECK: No thyromegaly. No carotid bruits.  CARDIOVASCULAR: Regular rate and rhythm. No murmurs, rubs, clicks or gallops. PMI is not displaced.  LUNGS: Clear to auscultation bilaterally without any rales, rhonchi or wheezing.  ABDOMEN: Soft, nontender, nondistended. Positive bowel sounds x 4.  EXTREMITIES: 1+ edema in his ankles and his lower extremity.  SKIN: He has very dry skin. Has what appears to be rosacea type of rash on his face.  LYMPHATICS: No lymph nodes palpable.  VASCULAR: Good DP, PT pulses.  PSYCHIATRIC: Not anxious or depressed.  NEUROLOGICAL: The patient is awake, not oriented to place, oriented to person.   LABORATORY AND RADIOLOGIC DATA: WBC 9.8, hemoglobin 11.6, platelet count 362. BMP: Glucose 201, BUN 12, creatinine 1.03, sodium 140, potassium 3.3, CO2 of 105, calcium 8.2. LFTs showed albumin of 2.5. Chest x-ray shows a left lower lobe infiltrate. CT of the head is negative. CT of the neck shows  degenerative changes. Elbow and knee x-rays are negative on the left.   ASSESSMENT AND PLAN: The patient is a frail 81 year old white male with chronic obstructive pulmonary disease with chronic respiratory failure who was brought in with a fall, persistent cough, some confusion. 1.  Persistent cough, generalized weakness: Possibly related to his pneumonia, although he is afebrile and WBC count is normal at this time. Will treat him with IV Levaquin. He is retaining some fluid in his lower extremities, so will hold off on any IV fluids for the time being. We will also get physical therapy evaluation.  2.  Chronic obstructive pulmonary disease: He will continue nebulizers and oxygen as doing at home. There is no evidence of acute exacerbation. I would hold off on steroids for the time being.  3.  Hypothyroidism: Continue levothyroxine as taking at home.  4.  Diabetes: Blood sugar is elevated currently. We will continue sliding scale insulin. He may need another additional medication added to his home regimen.  5.  Hypokalemia: We will replace his potassium.  6.  History of coronary artery disease: Continue Plavix as taking at home.  7.  Miscellaneous: The patient is Full Code. I have asked the daughter  regarding this. He was previously followed by hospice. If indeed he was Do Not Resuscitate when he was discharged, we may need to have palliative care come by and see the patient again to assist with his code status.   TIME SPENT: 45 minutes.   ____________________________ Lacie Scotts Allena Katz, MD shp:jm D: 11/03/2012 16:48:17 ET T: 11/03/2012 18:28:25 ET JOB#: [id number]  cc: Joslynne Klatt H. Allena Katz, MD, <Dictator> Charise Carwin MD ELECTRONICALLY SIGNED 11/06/2012 10:20

## 2015-01-16 NOTE — H&P (Signed)
PATIENT NAME:  [name], [name] MR#:  [id number] DATE OF BIRTH:  03/19/1921  DATE OF ADMISSION:  09/28/2012  PRIMARY CARE PHYSICIAN:  Dr. Benita Stabile.   CODE STATUS:  FULL CODE.   CHIEF COMPLAINT:  Shortness of breath.   HISTORY OF PRESENT ILLNESS:  The patient is an 81 year old male with a history of end stage COPD, oxygen dependent on hospice due to COPD, presents with progressive shortness of breath over the last 24 hours.  History is obtained from ED staff, family as well as a review of prior records.  Per daughter, patient has been in his usual state of health over the preceding weeks, however about a week ago he started developing increased sputum production with change in color to a yellow tint for which he was started on Levaquin by the hospice physician.  He has been on Levaquin now she estimates about 5 of 7 days.  He was also supposed to have started prednisone, but there was concern that it might raise his blood sugar so he was not on prednisone at this time.  On the morning of presentation around 2:30 a.m., 09/27/2012 patient was complaining of trouble breathing to his daughter.  Initially, the hospice nurse recommended Ativan to the family and he received multiple breathing treatments with little relief.  As a result the hospice nurse recommended that he be given his liquid morphine to assist his respiratory distress.  His typical dose is 0.25 to 0.5 mL q. 2 hours as needed.  Please note that each 1 mL  syringe contains 20 mg of morphine.  The daughter initially administered 0.25 mL with little relief and then the hospice nurse recommended that the full 1 mL syringe be given twice for a total of 2 mL which equates 40 mg of morphine, 8 times his typical dosing.  Subsequently, he started becoming increasingly lethargic where he would only open his eyes to his name, so the family became concerned, alerted EMS and he was brought to the Emergency Department.  In the Emergency Department he was noted  to be in severe respiratory distress with profound wheezes, decreased air movement on lung exam.  He received BiPAP, DuoNebs x 3, Decadron IV as well as magnesium IV infusion.  We are being called to admit him for COPD exacerbation.  Please note that on presentation, ABG showed pH of 7.28 with a pCO2 of 53.  When patient was evaluated by EMS, he received Narcan with some improvement in his mental status.  PAST MEDICAL HISTORY:  1.  Chronic obstructive pulmonary disease, oxygen dependent.  2.  Diabetes mellitus.  3.  Diabetic neuropathy.  4.  History of CVA.  5.  Hyperlipidemia.  6.  GERD.  7.  Constipation.  8.  Thyroid CA.  9.  Hypertension.   MEDICATIONS:   1.  Benadryl 25 mg twice daily as needed for itching.  2.  Steroid cream, unknown name at this time.  3.  Centrum Silver 1 tablet daily.  4.  Fexofenadine 180 mg daily.  5.  Gabapentin 300 mg daily.  6.  Laxative 5 mg oral delayed capsule daily.  7.  Haldol 0.5 mg daily at bedtime.  8.  Humalog 75/25 10 to 15 units subcutaneously twice daily.  9.  Lasix 20 mg daily as needed for swelling.  10.  Levothyroxine 125 mcg daily.  11.  Lorazepam 0.5 mg 1 tablet every 4 hours as needed for agitation.  12.  Morphine sulfate 0.25 mL to 0.5  mL orally every two hours as needed.  13.  Mucinex 1200 mg tablets extended release tablet daily.  14.  Nitrostat 0.4 mg sublingual as needed.  15.  Omeprazole 20 mg 1 capsule daily.  16.  Os-Cal with vitamin D daily.  17.  Pantoprazole 40 mg delayed release tablet daily.  18.  Plavix 75 mg 1 tablet daily.  19.  Polyethylene glycol 17 grams daily in the evening as needed for constipation.  20.  Prednisone 10 mg 1 tablet daily.  The patient has not started this medication yet.  21.  Simvastatin 80 mg 1 tablet daily.  22.  Surfak stool softener at 240 mg 1 capsule daily.  23.  Symbicort 160 mcg/4.5 mcg inhaled, 2 puffs twice daily.  24.  Ventolin inhaler 2 puffs inhaled every 4 to 6 hours.    ALLERGIES:  ASPIRIN, CODEINE, HYDROXYZINE, LORTAB, MOTRIN, PENICILLIN, SULFA DRUGS AND TEQUIN.   SOCIAL HISTORY:  Per records, 40 to 50 pack-year smoking history, quit 20 to 30 years ago.  No alcohol or illicit drug use.  Lives at home with his daughter.  His daughter that is at the bedside is not the daughter that lives with him.   FAMILY HISTORY:  Notable for breast cancer.   REVIEW OF SYSTEMS:   Unable to obtain review of systems from the patient as he is disoriented at this point in time.  Review of systems was evaluated and obtained from the daughter who notes that he has had some increased shortness of breath, increased mucus production.  No fevers.  No chest pain.  No leg swelling.  No difficulty with urination.  She does admit to constipation.   PHYSICAL EXAMINATION:  VITAL SIGNS:  Presentation temperature 98.9, heart rate 124, currently heart rate is 106, respirations 36, blood pressure 215/110, currently patient's blood pressure is 118/66.  He is saturating 94% on BiPAP.  GENERAL:  Elderly Caucasian male in no apparent distress at this point in time.  He is resting comfortably on BiPAP.  He was in severe distress upon initial evaluation.  EYES:  Anicteric sclerae.  Pupils equally round and reactive to light.  There is some proteinaceous material in the eyes.  EARS, NOSE, THROAT:  Normal pinnae, ears and nares.  Unable to examine posterior oropharynx.  PSYCHIATRIC:  The patient is somnolent, but responds to loud voice.  He is disoriented to time and place.  CARDIOVASCULAR:  S1, S2, tachycardic.  No pretibial edema.  Equal pulses bilateral upper and lower extremities.  PULMONARY:  Diffuse wheezes appreciated.  Normal effort at this time.  ABDOMEN:  Soft, nontender, nondistended.  No hepatomegaly appreciated.  SKIN:  There are multiple ecchymoses in the bilateral upper extremities.  Otherwise, skin is warm and dry.  MUSCULOSKELETAL:  Unable to appreciate full strength given patient's  mental status at this point.  No clubbing or cyanosis appreciated.  Gait not assessed.   LABORATORY DATA:  B-type natriuretic peptide 438, which is within normal limits.  CK is 524, which is elevated.  CK MB 7.9, which is elevated.  Troponin is less than 0.02.  Next, white count is 22.2, hemoglobin is 14.1, hematocrit is 42.7, platelets are 206, with an MCV of 100.   Glucose 139, BUN of 21, creatinine 2.34, this is increased from normal at 1.11 back in October.  Sodium is 140, potassium 4.8, chloride 104, bicarb 27, calcium 9.4, bilirubin 0.8, alk phos 150, ALT 38, AST 62.  Total protein 8.4, albumin 4.3, osmolality 285,  GFR is 24, anion gap is 9.  ABG showed pH 7.28, pCO2 53, pO2 520 with an FiO2 of 100% on BiPAP.  Bicarb was 24.9, lactate was 1.5.   EKG showed sinus tachycardia at 110 beats per minute.    Chest x-ray was within normal limits, final read is pending.   ASSESSMENT AND PLAN:  This is an 81 year old male with chronic obstructive pulmonary disease, end stage, presenting with chronic obstructive pulmonary disease exacerbation as well as opiate overdose.   1.  Chronic obstructive pulmonary disease exacerbation.  Continue steroids, nebulizer treatments, albuterol.  We will continue Levaquin.  Given his white count, we will also check blood cultures.  Continue BiPAP and this will be weaned as tolerated.  We will check influenza A and B serologies.   2.  Acute on chronic respiratory failure.  This patient is usually nasal cannula dependent; however, due to acute chronic obstructive pulmonary disease exacerbation will be on BiPAP.  This will  be weaned as tolerated.  3.  Acute renal failure.  Etiology of this is unclear.  Family notes that he might have had some decreased oral intake over the last couple of days, although he has been eating well.  Suspect that it might be a combination of that with ongoing diuretic use.  He is on Lasix.  We will hold his Lasix and hydrate him gently.  Monitor  his creatinine.  We will hold all nephrotoxic medications.  4.  Diabetes mellitus.  This is stable at this point.  Resume home dose insulin regimen.  5.  Acute respiratory acidosis due to chronic obstructive pulmonary disease exacerbation.  Continue BiPAP.  We will reassess.  Follow up ABG in the morning.  6.  Systemic inflammatory response syndrome with tachycardia, tachypnea and leukocytosis.  This might be due to chronic obstructive pulmonary disease exacerbation.  There is no evidence of pneumonia at this point.  We will check a urinalysis and urine culture to rule out underlying urinary infection.  7.  Hypothyroidism.  Continue Synthroid.   8.  Diabetic neuropathy.  Continue Neurontin.   DISPOSITION:  The patient is being admitted to the critical care unit for management of acute on chronic respiratory failure due to chronic obstructive pulmonary disease exacerbation.  He will be requiring continuous BiPAP at this point.  He will require at least two hospital midnight stays for evaluation and management.  He will be placed on a carb-controlled diet and will be evaluated by physical therapy prior to discharge as well as case management.   CRITICAL CARE TIME:  Forty-five minutes.     ____________________________ Samson Frederic, DO aeo:ea D: 09/28/2012 01:13:00 ET T: 09/28/2012 03:57:19 ET JOB#: [id number]  cc: Samson Frederic, DO, <Dictator> Orla Estrin E Lynell Kussman DO ELECTRONICALLY SIGNED 11/20/2012 3:43

## 2015-01-16 NOTE — Discharge Summary (Signed)
PATIENT NAME:  [name], [name] MR#:  [id number] DATE OF BIRTH:  Aug 02, 1921  DATE OF ADMISSION:  11/03/2012 DATE OF DISCHARGE:  11/07/2012  DISCHARGE DIAGNOSES:  1.  Pneumonia, much improved.  2.  Hypokalemia, repleted and resolved.   SECONDARY DIAGNOSES:  1.  Chronic obstructive pulmonary disease on 2 L oxygen.  2.  Type 2 diabetes.  3.  Coronary artery disease, status post stenting.  4.  History of thyroid cancer, status post thyroidectomy.  5.  Gastroesophageal reflux disease.  6.  Hyperlipidemia. 7.  Eczema with dry skin. 8.  Dementia. 9.  Hypokalemia.  CONSULTATION:  1.  Palliative care, Dr. Harvie Junior.  2.  Physical therapy. 3.  Psychiatric, Dr. Jennet Maduro.   PROCEDURES/RADIOLOGY: Chest x-ray on 02/06 showed interstitial pulmonary edema and possible left lower lobe pneumonia. Chest x-ray on 02/08 showed left lower lobe pneumonia.  Left knee x-ray on 02/08 showed no acute bony abnormality and small knee joint effusion.  Left elbow x-ray showed no acute abnormality. CT scan of the head without contrast on 02/08 showed no mass and no acute bony abnormality. CT scan of cervical spine without contrast showed severe DJD without any acute abnormality.   MAJOR LABORATORY PANEL: Blood cultures x 2 were negative on 02/06. Blood cultures x 1 was negative on 02/08.   HISTORY AND SHORT HOSPITAL COURSE: The patient is an 81 year old male with above-mentioned medical problems, who was admitted for left lower lobe pneumonia. He was started on IV antibiotics. Please see Dr. Eliane Decree dictated history and physical for further details. The patient was evaluated by psychiatry, Dr. Jennet Maduro concerning evaluation for decision-making capacity and he was deemed not capable of making his own decisions by them. Palliative care consultation was obtained with Dr. Harvie Junior, who had a long discussion with the family and recommended hospice care. The patient was subsequently in agreement along with all the family  members to get discharged home with hospice. He was doing fair on 02/12 and was discharged home in stable condition with hospice following him.   On the date of discharge, his vital signs were as follows: Temperature 98, heart rate 89 per minute, respirations 18 per minute, blood pressure 119/73 mmHg and he was saturating 92% on 2 L oxygen via nasal cannula.   PERTINENT PHYSICAL EXAMINATION ON THE DATE OF DISCHARGE:  CARDIOVASCULAR: S1, S2 normal. No murmurs, rubs, or gallop.  LUNGS: Clear to auscultation bilaterally. No wheezing, rales, rhonchi or crepitation.  ABDOMEN: Soft and benign.  NEUROLOGIC: Nonfocal examination. All other physical examination remained at baseline.   DISCHARGE MEDICATIONS:  1.  Fexofenadine 180 mg p.o. daily. 2.  DuoNeb inhaled every 6 hours as needed.  3.  Robitussin DM every 6 hours as needed.  4.  Mucinex 1200 mg p.o. b.i.d. as needed.  5.  Centrum Silver once daily.  6.  Levothyroxine 125 mcg p.o. daily.  7.  Nitrostat 0.4 mg sublingual every 5 minutes as needed.  8.  Omeprazole 20 mg p.o. daily.  9.  Gabapentin 300 mg p.o. daily.  10. Plavix 75 mg p.o. daily.  11. Symbicort 2 puffs inhaled twice a day.  12. Lasix 20 mg, 4 tablets once a day.  13. Simvastatin 80 mg p.o. daily.  14. Bisacodyl 5 mg once a day as needed.  15. Os-Cal with D once daily.  16. Protopic 0.1% topical ointment to affected area twice a day until clear, and then as needed.  17. Ventolin 2 puffs inhaled every 4 to 6 hours as  needed.  18. Clobetasol 0.05% topical solution to affected area 1 or 2 times a day as needed.  19. Humalog Quick Pen subcutaneous as per sliding scale if blood sugar more than 200.  20. Acetaminophen/hydrocodone 325/10 mg 1 tablet p.o. every 6 hours as needed.  21. Lorazepam 0.5 mg p.o. every 4 hours as needed.  22. Haldol 0.5 mg p.o. at bedtime.  23. Colace 100 mg p.o. b.i.d. as needed.  24. MiraLax once daily as needed.  25. Levofloxacin 500 mg p.o. daily as  prescribed.  26. Morphine 0.25 to 0.5 mL p.o. every 1 to 2 hours as needed.   DISCHARGE DIET: Low sodium.   DISCHARGE ACTIVITY: As tolerated.   DISCHARGE INSTRUCTIONS AND FOLLOWUP: The patient was instructed to follow up with his primary care physician in 1 to 2 weeks. He was set up to get hospice services at home.   TOTAL TIME DISCHARGING THIS PATIENT: 55 minutes.  ____________________________ Ellamae SiaVipul S. Sherryll BurgerShah, MD vss:aw D: 11/10/2012 15:23:29 ET T: 11/11/2012 07:55:38 ET JOB#: [id number]  cc: Severin Bou S. Sherryll BurgerShah, MD, <Dictator> Jillene Bucksenny C. Arlana Pouchate, MD Ned GraceNancy Phifer, MD Jolanta B. Jennet MaduroPucilowska, MD Patricia PesaVIPUL S Arrie Borrelli MD ELECTRONICALLY SIGNED 11/12/2012 19:24

## 2015-01-16 NOTE — Discharge Summary (Signed)
PATIENT NAME:  [name], [name] DATE OF BIRTH:  [birth year]  DATE OF ADMISSION:  12/02/2012 DATE OF DISCHARGE:  12/11/2012  Please refer to interim discharge summary dictated 2 days ago by Dr. Luberta MutterKonidena, which specified most of the issues around this hospitalization.  MEDICATIONS AT DISCHARGE:  1. Albuterol/Atrovent nebulizers p.r.n. shortness of breath.  2. Mucinex 1200 mg every 12 hours.  3. Bisacodyl 5 mg delayed release tablets once a day.  4. Centrum Silver once a day.  5. Levothyroxine 125 mcg once a day. 6. Os-Cal calcium with vitamin D once a day.  7. Nitrostat 0.4 mg p.r.n. chest pain.  8. Omeprazole 20 mg once a day. 9. Gabapentin 300 mg once a day. 10. Plavix 75 mg once a day. 11. Symbicort 160/4.5 two puffs twice daily.  12. Docusate 100 mg twice a day.  13. Polyethylene glycol 17 grams once a day.  14. Lasix 80 mg once a day. 15. Claritin 10 mg once a day. 16. Ventolin 90 mcg 2 puffs every 4 hours p.r.n.  17. Simvastatin 40 mg once a day.  18. Prednisone 40 mg every day, decrease by 10 mg every 2 days until prescription is finished. 19. Acetaminophen p.r.n. fever.  20. Morphine 20 mg/mL take 0.25 mL every 4 hours as needed for pain. 21. Norco 325/10 mg every 6 hours p.r.n. pain.  22. Humalog sliding scale.  23. Insulin Lispro 75/25 units take 22 units subcutaneously twice daily with meals.  24. Scopolamine 1 patch topically 3 times a day. 25. Nystatin 100,000 units every 6 hours oral suspension.  26. Haloperidol 0.5 mg once a day at bedtime. 27. Lorazepam 0.5 mg every 4 hours for agitation.  28. Potassium chloride 20 mEq once a day.  29. Amoxicillin (Dictation Anomaly) <<MISSING TEXT>> mg every 12 hours for 5 days.  30. Linezolid 600 mg every 12 hours for 5 days.  31. Dextromethorphan 10 mL as needed for cough. 32. Ensure 1 can twice daily.   FOLLOWUP INSTRUCTIONS: The patient to follow up with Dr. [name] in 1 to 2 weeks.   HOSPITAL COURSE:  Again, please refer to interim discharge summary dictated by Dr. Luberta MutterKonidena 2 days ago. From the last couple of days, the patient has been evaluated by palliative care. The family has decided to go full code despite the fact that the patient is very appropriate for hospice care at this moment. He has severe dementia. He is not eating. He cannot swallow without aspirating. He had silent aspiration with his last MBSS. He is at high risk of aspiration pneumonia. At this moment, the patient is a full code as per family's desire. There have been a lot of issues regarding the power of attorney and who is going to take care of the patient, but they decided to do a skilled nursing facility now. The patient's son wants a PEG tube, the 2 daughters do not, for which we are just going to send to the nursing home and get more ST/speech therapy, and if he continues to fail, they will decide if he is going to have or not the PEG tube, but that can be done outpatient.  As far as medical problems: 1. Acute on chronic respiratory failure. The patient has significant bronchitis. His bronchitis has been treated with broad-spectrum antibiotics. He was switched to Zyvox and aztreonam for the past couple of days, and he has improved a little bit his secretions, but he remains significantly ill.  2. Hypokalemia  is stable.  3. His leukocytosis was secondary to steroids. We are tapering off the steroids.  4. His diabetes has been worsened by steroids, for which his medications were increased, 75/25 was increased to 22 units twice daily.  5. He has coronary artery disease for which he is on Plavix and Zocor. 6. His Synthroid is working for his hypothyroidism.   As far as other medical problems, they were stable during this hospitalization.   TIME SPENT: I spent about 45 minutes with this discharge, a long conversation with the daughter. They still want him to be full code.    ____________________________ Felipa Furnace, MD rsg:OSi D: 12/14/2012 10:30:11 ET T: 12/14/2012 11:13:42 ET JOB#: [id number]  cc: Felipa Furnace, MD, <Dictator>

## 2015-01-16 NOTE — Consult Note (Signed)
Brief Consult Note: Diagnosis: Cognitive disorder NOS.   Patient was seen by consultant.   Consult note dictated.   Discussed with Attending MD.   Comments: Spoke with the patient tonight in the presence of his daughter and grandson.  The patient does not have the capacity to decide about DNR.  Electronic Signatures: Kristine LineaPucilowska, Terril Amaro (MD)  (Signed 11-Feb-14 18:09)  Authored: Brief Consult Note   Last Updated: 11-Feb-14 18:09 by Kristine LineaPucilowska, Donelda Mailhot (MD)

## 2015-01-16 NOTE — H&P (Signed)
PATIENT NAME:  [name], George B MR#:  [id number] DATE OF BIRTH:  1921-01-13  DATE OF ADMISSION:  12/02/2012  PRIMARY CARE PHYSICIAN:  Dr. [name].   REFERRING PHYSICIAN:  Dr. Chiquita LothJade Sung.   CHIEF COMPLAINT:  Increased shortness of breath and cough.   HISTORY OF PRESENT ILLNESS:  The patient is an 81 year old Caucasian male with history of dementia, recently admitted to this hospital with pneumonia, treated on February 8th and discharge on February 12th.  The patient was transferred to [facility].  He was in his usual state of health until last evening when patient noticed to have shortness of breath.  They checked his oxygen saturation on room air.  It was 86%.  The patient noted to have persistent cough and he was transferred to the Emergency Department for evaluation.  Here, his chest x-ray showed no new findings.  In fact, it looks better than his last chest x-ray.  However, he has diffuse rhonchi by chest examination and he was noted to have paroxysmal episodic cough.  The patient was admitted with acute exacerbation of chronic obstructive pulmonary disease.   REVIEW OF SYSTEMS:  The patient is in deep sleep.  I tried to wake him up.  He will respond, but he will go back to sleep again.  I could not have a meaningful 10 point system review.   PAST MEDICAL HISTORY:  Recent admission on February last month with pneumonia.  His history also includes advanced chronic obstructive pulmonary disease, home oxygen dependent on 2 liters.  Coronary disease, status post myocardial infarction with history of stent implants x 2.  Diabetes mellitus, type 2.  Gastroesophageal reflux disease, hyperlipidemia, dementia, hypokalemia.  History of thyroid cancer status post thyroidectomy.   PAST SURGICAL HISTORY:  Cholecystectomy, thyroidectomy, history of low back surgery.   SOCIAL HABITS:  Ex-chronic smoker.  He quit 26 years ago.  No history of alcohol or drug abuse.   SOCIAL HISTORY:  He is  widowed, used to live with his daughter, but right now he is residing at [facility].  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   FAMILY HISTORY:  Negative for premature coronary artery disease and COPD.   ADMISSION MEDICATIONS:  DuoNebs q. 6 hours as needed, Robitussin-DM q. 6 hours as needed, fexofenadine 180 mg once a day, Centrum Silver 1 tablet once a day, levothyroxine 125 mcg once a day, nitroglycerin sublingual 0.4 mg q. 5 minutes as needed, omeprazole 20 mg once a day, gabapentin 300 mg once a day, Plavix 75 mg a day, Lasix 80 mg once a day, Symbicort 2 puffs twice a day, simvastatin 80 mg once a day, bisacodyl 5 mg once a day as needed, MiraLAX 17 grams once a day as needed for constipation, Os-Cal D 1 tablet once a day, Humalog quick pen sliding scale for elevated blood sugar.  Hydrocodone with acetaminophen 10/325 q. 6 hours as needed, morphine taking 0.25 to 0.5 mg as needed, lorazepam 0.5 mg q. 4 hours as needed, Haldol 0.5 mg at bedtime and Colace 100 mg twice a day as needed.   ALLERGIES:  SULFA, ASPIRIN, SPIRIVA, CODEINE, PENICILLIN, LORABID AND TEQUIN.   PHYSICAL EXAMINATION: VITAL SIGNS:  Blood pressure 103/63, respiratory rate 18, pulse 80, temperature 98, oxygen saturation 99% on oxygen.  GENERAL APPEARANCE:  Elderly male lying in bed sleeping, every now and then noticed to have episode of cough, but unable to expectorate.  He  is in no acute distress.  The patient is sleeping, but arousable, then he will go back to sleep again.  HEAD AND NECK:  No pallor.  No icterus.  No cyanosis.  Ear examination revealed no lesions, no ulcers, no discharge.  Nasal mucosa was normal, no discharge, no bleeding.  Oral examination showed normal lips and tongue, no oral thrush.  I could not examine the throat adequately as he is not cooperative and sleepy.  Eye examination revealed normal eyelids and conjunctivae.  I could not examine the pupils adequately  as he is resisting, and very sleepy.  NECK:  Supple.  Trachea at midline.  No thyromegaly.  No masses.  HEART:  Normal S1, S2.  Distant heart sounds.  No murmur was appreciated.  No carotid bruits.  RESPIRATORY:  Diffuse scattered rhonchi.  No rales.  He is not using accessory muscles for breathing.  ABDOMEN:  Soft without tenderness.  No hepatosplenomegaly.  No masses.  No hernias.  SKIN:  No ulcers.  No subcutaneous nodules.  MUSCULOSKELETAL:  No joint swelling.  No clubbing.  NEUROLOGIC:  The patient is sleepy, but arousable, then he will go back to sleep again.  There is no facial asymmetry.  He is moving his extremities.  PSYCHIATRIC:  Unobtainable due to patient's sleepiness.   LABORATORY FINDINGS:  His chest x-ray showed no evidence of consolidation or effusion.  His arterial blood gas showed a pH of 7.45, pCO2 41, pO2 101.  This was on FiO2 of 36%.  Urinalysis was unremarkable.  CBC showed white count of 18,000, hemoglobin 13.9, hematocrit 40, platelet count 256.  Troponin less than 0.02.  Liver function tests were normal except for slightly low albumin at 3.2.  Serum glucose was 86, BUN 13, creatinine 1.1, sodium 139, potassium was 2.6.   ASSESSMENT: 1.  Dyspnea associated with cough secondary to acute exacerbation of chronic obstructive pulmonary disease.  No evidence of pneumonia so far.  2.  Coronary artery disease status post myocardial infarction and stent implants.  3.  Dementia.  4.  Diabetes mellitus, type 2.  5.  Severe hypokalemia.  6.  His other medical problems include hyperlipidemia, gastroesophageal reflux disease, history of thyroid cancer status post thyroidectomy.  The patient is home oxygen dependent on 2 liters.   PLAN:  We will admit the patient to the medical floor.  Blood cultures were taken.  Since admission was recently in the hospital, we will cover with broad-spectrum antibiotic.  The Emergency Department started cefepime.  I will continue the same antibiotic.   Intensify treatment with DuoNebs.  Continue inhalation therapy with beta agonist and steroid.  Accu-Cheks and sliding scale.  Continue home medications as listed above.  Deep vein thrombosis prophylaxis with heparin subcutaneously 5000 units twice a day.  I discussed the findings with his daughter.  She confirms that he has a LIVING WILL and his CODE STATUS so far is FULL CODE.  She added that if he has incurable condition then he should not stay on ventilator for a long time.  Therefore, at the time being he is a FULL CODE.    TIME SPENT IN EVALUATING THIS PATIENT AND REVIEWING HIS MEDICAL RECORDS:  Took more than 55 minutes.     ____________________________ Carney Corners. Rudene Re, MD amd:ea D: 12/02/2012 06:00:29 ET T: 12/02/2012 06:37:08 ET JOB#: [id number]  cc: Carney Corners. Rudene Re, MD, <Dictator> Zollie Scale MD ELECTRONICALLY SIGNED 12/04/2012 3:26

## 2015-01-18 NOTE — Discharge Summary (Signed)
PATIENT NAME:  [name], [name] MR#:  [id number] DATE OF BIRTH:  09-28-1920  DATE OF ADMISSION:  03/22/2012 DATE OF DISCHARGE:  03/27/2012  PRIMARY CARE PHYSICIAN: [name], MD  REASON FOR ADMISSION: Shortness of breath, cough, fever, chills, and wheezing.   DISCHARGE DIAGNOSES:  1. Right upper lobe/right middle lobe pneumonia suspect community-acquired pneumonia.  2. Systemic antiinflammatory response syndrome. 3. Acute renal failure, resolved. 4. History of chronic obstructive pulmonary disease. 5. History of asthma. 6. History of coronary artery disease.  7. History of diabetes mellitus.  8. History of hypothyroidism. 9. History of hypertension.  10. History of thyroid cancer status post thyroidectomy with hypothyroidism thereafter.   CONSULTANTS: None.   DISCHARGE DISPOSITION: Home.   DISCHARGE MEDICATIONS:  1. Levaquin 500 mg p.o. every 24 hours x5 days. 2. Spiriva Handi-Haler one cap inhaled daily.  3. DuoNebs one dose inhaled every 6 hours p.r.n. shortness of breath or wheezing.  4. Synthroid 150 mcg p.o. daily.  5. Simvastatin 80 mg daily.  6. Docusate sodium 100 mg p.o. daily. 7. Plavix 75 mg daily. 8. Os-Cal with vitamin D 1 tablet daily. 9. Centrum Silver 1 tablet daily.  10. Fexofenadine 180 mg p.o. daily.  11. Protonix 40 mg p.o. daily.  12. Hydrocodone/acetaminophen 5/500 mg 1 to 2 tablets p.o. daily as needed for pain.  13. Protopic 0.1% topical ointment apply to affected area twice a day. 14. Clobetasol 0.05% apply as previously directed by your primary care physician.  15. Novolin R sliding scale insulin as before.  16. Humalog mix 75/25, 15 units subcutaneously twice a day.  17. MiraLax 17 grams p.o. daily p.r.n. constipation.  18. Senna S 1 tab p.o. daily. 19. Ventolin inhaler 2 puffs inhaled every 4 to 6 hours p.r.n.  20. Dulcolax 5 mg p.o. daily p.r.n. constipation.  21. Nitrostat 0.4 mg sublingually every 5 minutes x3 p.r.n. chest pain.   22. Symbicort 4.5/160 mcg two puffs inhaled twice a day.  DISCHARGE CONDITION: Improved, stable.  DISCHARGE ACTIVITY: As tolerated.  DISCHARGE DIET: Low sodium, ADA, low fat, low cholesterol.   DISCHARGE INSTRUCTIONS:  1. Take medications as prescribed.  2. Return to the emergency department for recurrence of symptoms or for worsening shortness of breath, cough, wheezing, fever, or chills.  FOLLOWUP INSTRUCTIONS: Follow-up with Dr. [name] within 1 to 2 weeks. The patient needs repeat blood pressure check within one week.   REFERRALS: The patient is being referred to [name] of pulmonology within 2 to 3 weeks for chronic obstructive pulmonary disease. The patient is being referred to [name] of endocrinology within 2 to 3 weeks for history of thyroid cancer and hypothyroidism. The patient needs repeat TSH in two weeks.   PROCEDURES: Chest x-ray, PA and lateral, on 03/22/2012: Right upper and middle lobe pneumonia.   PERTINENT LABORATORY DATA: BUN 22 and creatinine 1.38 on admission with BUN 10 and creatinine 0.93 from 03/25/2012.   Hemoglobin A1c 7.4.   TSH elevated at 8.43 with normal free T4 of 1.15 and low free T3 of 1.5 for which the patient's Synthroid dose has been increased.   Blood cultures x2 from 03/22/2012: No growth to date.   Sputum culture from 03/24/2012: Normal flora.   CBC normal on admission, except for WBC 16.1. WBC 9 from 03/25/2012.   BRIEF HISTORY/HOSPITAL COURSE: The patient is an 81 year old male with extensive past medical history including hypertension, coronary artery disease, asthma, chronic obstructive pulmonary disease, diabetes mellitus, and thyroid cancer status  post thyroidectomy who presented to the emergency department with complaints of shortness of breath, fever, chills, cough, and wheezing. Please see the dictated admission history and physical for pertinent details surrounding the onset of this hospitalization and please see  below for further details.  1. Right upper lobe/right middle lobe pneumonia - in a patient who was noted to have shortness of breath, fevers, chills, cough, and wheezing at time of admission with chest x-ray revealing right upper and middle lobe pneumonia. Blood cultures were obtained and the patient was empirically started on IV antibiotics and thereafter admitted to the hospital. He was maintained on supplemental oxygen via nasal cannula. He had evidence of SIRS manifested by fevers, tachycardia, mild hypotension, and leukocytosis. With IV antibiotic therapy as well as IV fluids his SIRS has resolved and the patient has defervesced and his WBC has normalized and his heart rate and blood pressure have normalized as well. Blood cultures do not reveal any growth to date. Sputum was also collected and sputum culture revealed normal flora. The patient has completed five days of inpatient antibiotic therapy and is recommended a total of 10 days of treatment and has 5 additional days of therapy remaining at the time of discharge. Once his condition was noted to have improved, he was transitioned off IV antibiotics onto to oral antibiotics which he will continue/complete as an outpatient. With antibiotic therapy, the patient's condition has significantly improved and he has successfully been weaned off oxygen and he uses oxygen at home on a p.r.n. basis. The patient's wheezing had quickly resolved and he was not felt to have chronic obstructive pulmonary disease or asthma exacerbation. For COPD he was maintained on Symbicort and he has also been started on Spiriva and given p.r.n. bronchodilator therapy with DuoNebs and albuterol metered dose inhaler. He will be referred to pulmonology upon discharge for chronic obstructive pulmonary disease and asthma management. He had acute renal failure at the time of admission and this was felt to be related to SIRS and possible ATN from mild hypotension and he may also have had a  prerenal component from volume depletion. The patient's renal function has normalized with IV fluids and blood pressure support.  2. In regards to coronary artery disease, he was without any chest pain. He was maintained on medical management and Plavix and statin therapy.  3. For diabetes mellitus, the patient was maintained on his outpatient diabetic regimen with insulin and blood sugars have been relatively well controlled during this hospitalization and he will be referred to endocrinology upon discharge for management of hypothyroidism as well as diabetes mellitus.  4. In regards to hypothyroidism, the patient has history of thyroid cancer status post thyroidectomy. TSH was elevated with low free T3 and T4 so his Synthroid dose has been increased and he is being referred to endocrinology as an outpatient upon hospital discharge.  5. In regards to hypertension, his blood pressure medications were placed on hold as he presented with hypotension. He was taking Lasix at home on a p.r.n. basis and due to hypotension and acute renal failure this was held. With IV hydration and IV antibiotics, his blood pressure has normalized. He is normotensive at the time of discharge off of any medications. Therefore, he was advised to continue to hold his blood pressure medication until he follows up with his primary care physician as an outpatient for further management of his blood pressure.   On 03/27/2012, the patient was hemodynamically stable and significant improvement of his cough  and resolution of his fevers, chills, shortness of breath and wheezing and he was felt to be stable for discharge home with close outpatient follow-up to which the patient was agreeable.   TIME SPENT ON DISCHARGE: Greater than 30 minutes. ____________________________ Elon Alas, MD knl:slb D: 03/31/2012 19:01:40 ET T: 04/02/2012 12:45:37 ET JOB#: [id number]  cc: Elon Alas, MD, <Dictator> Jillene Bucks Arlana Pouch, MD Herbon E.  Meredeth Ide, MD A. Wendall Mola, MD Elon Alas MD ELECTRONICALLY SIGNED 04/06/2012 17:34

## 2015-01-18 NOTE — H&P (Signed)
PATIENT NAME:  [name], [name] MR#:  [id number] DATE OF BIRTH:  [birth year]  DATE OF ADMISSION:  03/22/2012  PRIMARY CARE PHYSICIAN: Dewaine Oats, MD   CHIEF COMPLAINT: Shortness of breath and pneumonia.   HISTORY OF PRESENT ILLNESS: This is an 81 year old male who presents with shortness of breath, wheezing, fever and chills for the past two days. The patient had a chest x-ray performed here which shows right upper lobe and middle lobe pneumonia. The patient has been started on Levaquin. He had been treated for pneumonia in November, also on Levaquin.   REVIEW OF SYSTEMS: CONSTITUTIONAL: Positive fever. Positive fatigue and weakness. EYES: No blurred or double vision. ENT: No ear pain. Positive hearing loss. No postnasal drip. Positive seasonal allergies. RESPIRATORY: Positive cough without sputum production. No wheezing. Positive history of COPD. CARDIOVASCULAR: No chest pain, orthopnea, palpitations, or syncope. GI: Dyspnea on exertion. GI: No nausea, vomiting, diarrhea, abdominal pain, melena, or ulcers. GU: No dysuria or hematuria. ENDOCRINE: No polyuria or polydipsia.  HEME/LYMPH: Positive anemia and easy bruising. SKIN: No rash or lesions. MUSCULOSKELETAL: No pain in the neck or back. NEUROLOGIC: No history of transient ischemic attack or seizures. PSYCH: No history of anxiety or depression.   PAST MEDICAL HISTORY:  1. Chronic obstructive pulmonary disease. He wears oxygen while he is at home, but not when he is working.  2. Thyroid cancer.  3. Hypertension.  4. Coronary artery disease.  5. Hyperlipidemia.  6. Asthma.  7. Diabetes.   PAST SURGICAL HISTORY:  1. Cardiac stents.  2. Cholecystectomy.  3. Appendectomy.  4. Thyroidectomy.  5. Back surgery.   ALLERGIES: Aspirin causes rash and swelling. Codeine unknown. Duratuss causes itching and rash. Hydroxyzine, Lorabid, Motrin, penicillin, sulfa, and he says Tequin, although the patient has been on Levaquin before and receiving Levaquin  here without a reaction.   MEDICATIONS:  1. Acetaminophen/hydrocodone 500/5 mg one to two tablets every four hours p.r.n. pain.  2. Centrum Silver daily.  3. Clobetasol 0.05% as directed.  4. Docusate sodium 100 mg daily.  5. Allegra 180 mg daily.  6. Humalog 75/25, 15 units twice a day.  7. Lasix 20 mg daily p.r.n.  8. Synthroid 125 mcg daily.  9. MiraLax p.r.n.  10. Novolin sliding scale.  11. Os-Cal with D 1 tablet daily.  12. Pantoprazole 40 mg daily.  13. Plavix 75 mg daily.  14. Protopic 0.1% to affected area twice a day p.r.n.  15. Senna 1 tablet daily.  16. Simvastatin 80 mg daily.  17. Symbicort 2 puffs twice a day.  18. Phenytoin HFA 2 puffs every 4 to 6 hours.   FAMILY HISTORY: Two brothers with lung cancer and diabetes.  SOCIAL HISTORY: The patient lives with his daughter. No tobacco, alcohol, or drug use.   PHYSICAL EXAMINATION:   VITAL SIGNS: Temperature 100.7, pulse 92, respirations 20, blood pressure 114/59, and 99% on 2 liters.   GENERAL: The patient is alert and oriented, not in acute distress.  HEENT: Head is atraumatic. Pupils are round and reactive. Sclerae anicteric. Mucous membranes are moist. Oropharynx is clear.   NECK: Supple without jugular venous distention, carotid bruit, or enlarged thyroid.  CARDIOVASCULAR: Regular rate and rhythm. No murmurs, gallops, or rubs. PMI is nondisplaced.   LUNGS: Decreased breath sounds at the right upper lobe with some crackles. No dullness to percussion.   BACK: No costovertebral angle tenderness.   ABDOMEN: Bowel sounds are positive. Nontender and nondistended. No hepatosplenomegaly.   EXTREMITIES: No clubbing, cyanosis,  or edema.   NEURO: Cranial nerves II through XII grossly intact. No focal deficits.   SKIN: No rash or lesions.   LABORATORY, DIAGNOSTIC AND RADIOLOGIC DATA: Sodium 141, potassium 4.3, chloride 106, bicarbonate 25, BUN 22, creatinine 1.38, and glucose 242. Total protein 7.3, albumin 4.0,  bilirubin 0.6, alkaline phosphatase 754, AST 29, and ALT 18. White blood cells 16, hemoglobin 13.6, hematocrit 41, and platelets 194. INR 0.9.   Urinalysis shows no leukocyte esterase or nitrites.   Chest x-ray shows right upper lobe and right middle lobe pneumonia.   EKG normal sinus rhythm. No ST elevations or depression.   ASSESSMENT AND PLAN: This is an 81 year old male who presents with community-acquired pneumonia.  1. Community-acquired pneumonia. The patient will be treated with Levaquin. Blood cultures have been ordered in the ER. We will continue to follow.  2. SIRS secondary to community-acquired pneumonia with leukocytosis, low-grade fever, and elevated heart rate, greater than 90. We will treat community-acquired pneumonia and will monitor these parameters.  3. Acute renal failure. We will gently hydrate the patient and repeat a BMP in the a.m.  4. Diabetes. Continue his outpatient treatment and ADA diet and sliding scale insulin.  5. History of coronary artery disease. We will continue Plavix and statin.  6. Hypothyroidism. Continue Synthroid.   CODE STATUS: The patient is DO NOT RESUSCITATE status.   TIME SPENT: Approximately 45 minutes. ____________________________ Janyth ContesSital P. Juliene PinaMody, MD spm:slb D: 03/22/2012 16:00:18 ET T: 03/22/2012 16:24:38 ET JOB#: [id number]  cc: Aijah Lattner P. Juliene PinaMody, MD, <Dictator> Jillene Bucksenny C. Arlana Pouchate, MD Janyth ContesSITAL P Raynelle Fujikawa MD ELECTRONICALLY SIGNED 03/22/2012 18:06
